# Patient Record
Sex: Female | Born: 1975 | Hispanic: No | Marital: Married | Smoking: Never smoker
Health system: Southern US, Community
[De-identification: ages and names within clinical notes are randomized; demographics above are authoritative.]

## PROBLEM LIST (undated history)

## (undated) DIAGNOSIS — R519 Headache, unspecified: Secondary | ICD-10-CM

## (undated) DIAGNOSIS — F329 Major depressive disorder, single episode, unspecified: Secondary | ICD-10-CM

## (undated) DIAGNOSIS — F419 Anxiety disorder, unspecified: Secondary | ICD-10-CM

## (undated) DIAGNOSIS — R51 Headache: Secondary | ICD-10-CM

## (undated) DIAGNOSIS — F32A Depression, unspecified: Secondary | ICD-10-CM

## (undated) DIAGNOSIS — E119 Type 2 diabetes mellitus without complications: Secondary | ICD-10-CM

---
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
## 2001-12-20 ENCOUNTER — Inpatient Hospital Stay (HOSPITAL_COMMUNITY): Admission: AD | Admit: 2001-12-20 | Discharge: 2001-12-22 | Payer: Self-pay | Admitting: Obstetrics and Gynecology

## 2002-01-25 ENCOUNTER — Other Ambulatory Visit: Admission: RE | Admit: 2002-01-25 | Discharge: 2002-01-25 | Payer: Self-pay | Admitting: Obstetrics and Gynecology

## 2003-02-12 ENCOUNTER — Other Ambulatory Visit: Admission: RE | Admit: 2003-02-12 | Discharge: 2003-02-12 | Payer: Self-pay | Admitting: Obstetrics and Gynecology

## 2003-09-04 ENCOUNTER — Encounter: Admission: RE | Admit: 2003-09-04 | Discharge: 2003-09-04 | Payer: Self-pay | Admitting: Obstetrics and Gynecology

## 2003-11-09 ENCOUNTER — Inpatient Hospital Stay (HOSPITAL_COMMUNITY): Admission: AD | Admit: 2003-11-09 | Discharge: 2003-11-11 | Payer: Self-pay | Admitting: Obstetrics & Gynecology

## 2004-02-13 ENCOUNTER — Other Ambulatory Visit: Admission: RE | Admit: 2004-02-13 | Discharge: 2004-02-13 | Payer: Self-pay | Admitting: Obstetrics and Gynecology

## 2005-03-05 ENCOUNTER — Other Ambulatory Visit: Admission: RE | Admit: 2005-03-05 | Discharge: 2005-03-05 | Payer: Self-pay | Admitting: Obstetrics and Gynecology

## 2005-08-10 ENCOUNTER — Encounter: Admission: RE | Admit: 2005-08-10 | Discharge: 2005-08-10 | Payer: Self-pay | Admitting: Obstetrics and Gynecology

## 2015-02-27 DIAGNOSIS — R6882 Decreased libido: Secondary | ICD-10-CM | POA: Insufficient documentation

## 2016-07-24 DIAGNOSIS — N926 Irregular menstruation, unspecified: Secondary | ICD-10-CM | POA: Insufficient documentation

## 2016-09-23 ENCOUNTER — Encounter (HOSPITAL_COMMUNITY): Payer: Self-pay | Admitting: *Deleted

## 2016-09-29 ENCOUNTER — Other Ambulatory Visit: Payer: Self-pay | Admitting: Obstetrics and Gynecology

## 2016-10-02 ENCOUNTER — Encounter (HOSPITAL_COMMUNITY): Payer: Self-pay | Admitting: Anesthesiology

## 2016-10-02 ENCOUNTER — Ambulatory Visit (HOSPITAL_COMMUNITY): Payer: BC Managed Care – PPO | Admitting: Anesthesiology

## 2016-10-02 ENCOUNTER — Encounter (HOSPITAL_COMMUNITY)
Admission: RE | Disposition: A | Discharge status: Home / Self Care | Payer: Self-pay | Source: Ambulatory Visit | Attending: Obstetrics and Gynecology

## 2016-10-02 ENCOUNTER — Ambulatory Visit (HOSPITAL_COMMUNITY)
Admission: RE | Admit: 2016-10-02 | Discharge: 2016-10-02 | Disposition: A | Discharge status: Home / Self Care | Payer: BC Managed Care – PPO | Source: Ambulatory Visit | Attending: Obstetrics and Gynecology | Admitting: Obstetrics and Gynecology

## 2016-10-02 DIAGNOSIS — Z79899 Other long term (current) drug therapy: Secondary | ICD-10-CM | POA: Diagnosis not present

## 2016-10-02 DIAGNOSIS — N938 Other specified abnormal uterine and vaginal bleeding: Secondary | ICD-10-CM | POA: Insufficient documentation

## 2016-10-02 DIAGNOSIS — F329 Major depressive disorder, single episode, unspecified: Secondary | ICD-10-CM | POA: Insufficient documentation

## 2016-10-02 HISTORY — DX: Major depressive disorder, single episode, unspecified: F32.9

## 2016-10-02 HISTORY — DX: Depression, unspecified: F32.A

## 2016-10-02 HISTORY — DX: Headache: R51

## 2016-10-02 HISTORY — DX: Headache, unspecified: R51.9

## 2016-10-02 HISTORY — DX: Type 2 diabetes mellitus without complications: E11.9

## 2016-10-02 HISTORY — PX: DILATATION & CURETTAGE/HYSTEROSCOPY WITH MYOSURE: SHX6511

## 2016-10-02 HISTORY — DX: Anxiety disorder, unspecified: F41.9

## 2016-10-02 LAB — CBC
HCT: 38.7 % (ref 36.0–46.0)
HEMOGLOBIN: 13.2 g/dL (ref 12.0–15.0)
MCH: 30.6 pg (ref 26.0–34.0)
MCHC: 34.1 g/dL (ref 30.0–36.0)
MCV: 89.6 fL (ref 78.0–100.0)
Platelets: 269 10*3/uL (ref 150–400)
RBC: 4.32 MIL/uL (ref 3.87–5.11)
RDW: 12.9 % (ref 11.5–15.5)
WBC: 8.5 10*3/uL (ref 4.0–10.5)

## 2016-10-02 LAB — PREGNANCY, URINE: Preg Test, Ur: NEGATIVE

## 2016-10-02 SURGERY — DILATATION & CURETTAGE/HYSTEROSCOPY WITH MYOSURE
Anesthesia: General | Site: Vagina

## 2016-10-02 MED ORDER — LIDOCAINE HCL (CARDIAC) 20 MG/ML IV SOLN
INTRAVENOUS | Status: AC
  Filled 2016-10-02: qty 5

## 2016-10-02 MED ORDER — ONDANSETRON HCL 4 MG/2ML IJ SOLN: 4.0000 mg | Freq: Once | INTRAMUSCULAR | Status: DC | PRN

## 2016-10-02 MED ORDER — DEXAMETHASONE SODIUM PHOSPHATE 4 MG/ML IJ SOLN
INTRAMUSCULAR | Status: AC
  Filled 2016-10-02: qty 1

## 2016-10-02 MED ORDER — KETOROLAC TROMETHAMINE 30 MG/ML IJ SOLN: 30.0000 mg | Freq: Once | INTRAMUSCULAR | Status: DC

## 2016-10-02 MED ORDER — LACTATED RINGERS IV SOLN
INTRAVENOUS | Status: DC
  Administered 2016-10-02: 125 mL/h via INTRAVENOUS

## 2016-10-02 MED ORDER — ROCURONIUM BROMIDE 100 MG/10ML IV SOLN
INTRAVENOUS | Status: AC
  Filled 2016-10-02: qty 1

## 2016-10-02 MED ORDER — PROPOFOL 10 MG/ML IV BOLUS
INTRAVENOUS | Status: AC
  Filled 2016-10-02: qty 20

## 2016-10-02 MED ORDER — MIDAZOLAM HCL 2 MG/2ML IJ SOLN
INTRAMUSCULAR | Status: AC
  Filled 2016-10-02: qty 2

## 2016-10-02 MED ORDER — KETOROLAC TROMETHAMINE 30 MG/ML IJ SOLN
INTRAMUSCULAR | Status: DC | PRN
  Administered 2016-10-02: 30 mg via INTRAVENOUS

## 2016-10-02 MED ORDER — KETOROLAC TROMETHAMINE 30 MG/ML IJ SOLN
INTRAMUSCULAR | Status: AC
  Filled 2016-10-02: qty 1

## 2016-10-02 MED ORDER — ONDANSETRON HCL 4 MG/2ML IJ SOLN
INTRAMUSCULAR | Status: AC
  Filled 2016-10-02: qty 2

## 2016-10-02 MED ORDER — ONDANSETRON HCL 4 MG/2ML IJ SOLN
INTRAMUSCULAR | Status: DC | PRN
  Administered 2016-10-02: 4 mg via INTRAVENOUS

## 2016-10-02 MED ORDER — DEXAMETHASONE SODIUM PHOSPHATE 10 MG/ML IJ SOLN
INTRAMUSCULAR | Status: DC | PRN
  Administered 2016-10-02: 4 mg via INTRAVENOUS

## 2016-10-02 MED ORDER — SCOPOLAMINE 1 MG/3DAYS TD PT72
MEDICATED_PATCH | TRANSDERMAL | Status: AC
  Administered 2016-10-02: 1.5 mg via TRANSDERMAL
  Filled 2016-10-02: qty 1

## 2016-10-02 MED ORDER — SODIUM CHLORIDE 0.9 % IR SOLN
Status: DC | PRN
  Administered 2016-10-02: 3000 mL

## 2016-10-02 MED ORDER — OXYCODONE HCL 5 MG PO TABS: 5.0000 mg | ORAL_TABLET | Freq: Once | ORAL | Status: DC | PRN

## 2016-10-02 MED ORDER — LIDOCAINE HCL 1 % IJ SOLN
INTRAMUSCULAR | Status: AC
  Filled 2016-10-02: qty 20

## 2016-10-02 MED ORDER — OXYCODONE HCL 5 MG/5ML PO SOLN: 5.0000 mg | Freq: Once | ORAL | Status: DC | PRN

## 2016-10-02 MED ORDER — PROPOFOL 10 MG/ML IV BOLUS
INTRAVENOUS | Status: DC | PRN
  Administered 2016-10-02: 150 mg via INTRAVENOUS

## 2016-10-02 MED ORDER — FENTANYL CITRATE (PF) 100 MCG/2ML IJ SOLN
INTRAMUSCULAR | Status: DC | PRN
  Administered 2016-10-02: 50 ug via INTRAVENOUS
  Administered 2016-10-02: 100 ug via INTRAVENOUS

## 2016-10-02 MED ORDER — OXYCODONE-ACETAMINOPHEN 5-325 MG PO TABS: 1.0000 | ORAL_TABLET | Freq: Four times a day (QID) | ORAL | 0 refills | Status: DC | PRN

## 2016-10-02 MED ORDER — OXYCODONE-ACETAMINOPHEN 5-325 MG PO TABS: 1.0000 | ORAL_TABLET | Freq: Four times a day (QID) | ORAL | Status: DC | PRN

## 2016-10-02 MED ORDER — FENTANYL CITRATE (PF) 100 MCG/2ML IJ SOLN: 25.0000 ug | INTRAMUSCULAR | Status: DC | PRN

## 2016-10-02 MED ORDER — FENTANYL CITRATE (PF) 250 MCG/5ML IJ SOLN
INTRAMUSCULAR | Status: AC
  Filled 2016-10-02: qty 5

## 2016-10-02 MED ORDER — SCOPOLAMINE 1 MG/3DAYS TD PT72
1.0000 | MEDICATED_PATCH | Freq: Once | TRANSDERMAL | Status: DC
  Administered 2016-10-02: 1.5 mg via TRANSDERMAL

## 2016-10-02 MED ORDER — ACETAMINOPHEN 325 MG PO TABS: 325.0000 mg | ORAL_TABLET | ORAL | Status: DC | PRN

## 2016-10-02 MED ORDER — MIDAZOLAM HCL 2 MG/2ML IJ SOLN
INTRAMUSCULAR | Status: DC | PRN
  Administered 2016-10-02: 2 mg via INTRAVENOUS

## 2016-10-02 MED ORDER — MEPERIDINE HCL 25 MG/ML IJ SOLN: 6.2500 mg | INTRAMUSCULAR | Status: DC | PRN

## 2016-10-02 MED ORDER — LIDOCAINE HCL 1 % IJ SOLN
INTRAMUSCULAR | Status: DC | PRN
  Administered 2016-10-02: 10 mL

## 2016-10-02 MED ORDER — LIDOCAINE HCL (CARDIAC) 20 MG/ML IV SOLN
INTRAVENOUS | Status: DC | PRN
  Administered 2016-10-02: 50 mg via INTRAVENOUS

## 2016-10-02 MED ORDER — ACETAMINOPHEN 160 MG/5ML PO SOLN: 325.0000 mg | ORAL | Status: DC | PRN

## 2016-10-02 SURGICAL SUPPLY — 20 items
CANISTER SUCT 3000ML (MISCELLANEOUS) ×4 IMPLANT
CATH ROBINSON RED A/P 16FR (CATHETERS) ×4 IMPLANT
CLOTH BEACON ORANGE TIMEOUT ST (SAFETY) ×4 IMPLANT
CONTAINER PREFILL 10% NBF 60ML (FORM) ×5 IMPLANT
DEVICE MYOSURE LITE (MISCELLANEOUS) IMPLANT
DEVICE MYOSURE REACH (MISCELLANEOUS) IMPLANT
FILTER ARTHROSCOPY CONVERTOR (FILTER) ×4 IMPLANT
GLOVE BIOGEL PI IND STRL 6.5 (GLOVE) ×2 IMPLANT
GLOVE BIOGEL PI IND STRL 7.0 (GLOVE) ×2 IMPLANT
GLOVE BIOGEL PI INDICATOR 6.5 (GLOVE) ×2
GLOVE BIOGEL PI INDICATOR 7.0 (GLOVE) ×2
GLOVE ECLIPSE 6.5 STRL STRAW (GLOVE) ×4 IMPLANT
GOWN STRL REUS W/TWL LRG LVL3 (GOWN DISPOSABLE) ×8 IMPLANT
PACK VAGINAL MINOR WOMEN LF (CUSTOM PROCEDURE TRAY) ×4 IMPLANT
PAD OB MATERNITY 4.3X12.25 (PERSONAL CARE ITEMS) ×4 IMPLANT
SEAL ROD LENS SCOPE MYOSURE (ABLATOR) IMPLANT
TOWEL OR 17X24 6PK STRL BLUE (TOWEL DISPOSABLE) ×8 IMPLANT
TUBING AQUILEX INFLOW (TUBING) ×4 IMPLANT
TUBING AQUILEX OUTFLOW (TUBING) ×4 IMPLANT
WATER STERILE IRR 1000ML POUR (IV SOLUTION) ×4 IMPLANT

## 2016-10-02 NOTE — Brief Op Note (Signed)
10/02/2016  12:01 PM  PATIENT:  Melissa Rice  41 y.o. female  PRE-OPERATIVE DIAGNOSIS:  Dysfunctional Uterine Bleeding  POST-OPERATIVE DIAGNOSIS:  Dysfunctional Uterine Bleeding  PROCEDURE:  Procedure(s): DILATATION & CURETTAGE/HYSTEROSCOPY (N/A)  SURGEON:  Surgeon(s) and Role:    Allyn Kenner, DO - Primary  ANESTHESIA:   local and MAC  EBL:  Total I/O In: -  Out: 25 [Urine:20; Blood:5]  LOCAL MEDICATIONS USED:  LIDOCAINE  and Amount: 10 ml  SPECIMEN:  Source of Specimen:  EMC  DISPOSITION OF SPECIMEN:  PATHOLOGY  COUNTS:  YES  PLAN OF CARE: Discharge to home after PACU  PATIENT DISPOSITION:  PACU - hemodynamically stable.   Delay start of Pharmacological VTE agent (>24hrs) due to surgical blood loss or risk of bleeding: not applicable

## 2016-10-02 NOTE — H&P (Signed)
41 y.o. yo complains of irregular periods, known small fibroids.  US performed 07/25/2016: uterus AV 9 x 5.8 x 6cm EMS 17.51 with suspected polyp 2.4cm. Fibroids 0.9-3.9cm combo of intramural and subsurosal.   Past Medical History:  Diagnosis Date  . Anxiety   . Depression   . Diabetes mellitus without complication (HCC)    GESTATIONAL  . Headache    OCCASIONAL   Past Surgical History:  Procedure Laterality Date  . GANGLION CYST EXCISION Right 1992    Social History   Social History  . Marital status: Married    Spouse name: N/A  . Number of children: N/A  . Years of education: N/A   Occupational History  . Not on file.   Social History Main Topics  . Smoking status: Never Smoker  . Smokeless tobacco: Never Used  . Alcohol use Yes     Comment: OCCASIONAL  . Drug use: No  . Sexual activity: Not on file   Other Topics Concern  . Not on file   Social History Narrative  . No narrative on file    No current facility-administered medications on file prior to encounter.    No current outpatient prescriptions on file prior to encounter.    No Known Allergies  @VITALS2 @  Lungs: clear to ascultation Cor:  RRR Abdomen:  soft, nontender, nondistended. Ex:  no cords, erythema Pelvic:  Deferred to OR  A:  D&C hysteroscopy polypectomy with myosure   P: All risks, benefits and alternatives d/w patient and she desires to proceed.  Routine pre-op care    CushingALLAHAN, Luther ParodySIDNEY

## 2016-10-02 NOTE — Anesthesia Postprocedure Evaluation (Addendum)
Anesthesia Post Note  Patient: Melissa StarlingJoan S Kates  Procedure(s) Performed: Procedure(s) (LRB): DILATATION & CURETTAGE/HYSTEROSCOPY (N/A)  Patient location during evaluation: PACU Anesthesia Type: General Level of consciousness: awake Pain management: pain level controlled Vital Signs Assessment: post-procedure vital signs reviewed and stable Respiratory status: spontaneous breathing Cardiovascular status: stable Postop Assessment: no signs of nausea or vomiting Anesthetic complications: no        Last Vitals:  Vitals:   10/02/16 1230 10/02/16 1245  BP: 102/66 108/66  Pulse: 86 82  Resp: 12 15  Temp:      Last Pain:  Vitals:   10/02/16 1031  TempSrc: Oral   Pain Goal: Patients Stated Pain Goal: 5 (10/02/16 1031)               Brae Schaafsma JR,JOHN Susann GivensFRANKLIN

## 2016-10-02 NOTE — Anesthesia Procedure Notes (Signed)
Procedure Name: LMA Insertion Date/Time: 10/02/2016 11:37 AM Performed by: Shanon PayorGREGORY, Tyriek Hofman M Pre-anesthesia Checklist: Patient identified, Emergency Drugs available, Suction available, Patient being monitored and Timeout performed Patient Re-evaluated:Patient Re-evaluated prior to inductionOxygen Delivery Method: Circle system utilized Preoxygenation: Pre-oxygenation with 100% oxygen Intubation Type: IV induction LMA: LMA inserted LMA Size: 3.0 Number of attempts: 1 Placement Confirmation: positive ETCO2 and breath sounds checked- equal and bilateral Tube secured with: Tape Dental Injury: Teeth and Oropharynx as per pre-operative assessment

## 2016-10-02 NOTE — Transfer of Care (Signed)
Immediate Anesthesia Transfer of Care Note  Patient: Melissa StarlingJoan S Miron  Procedure(s) Performed: Procedure(s): DILATATION & CURETTAGE/HYSTEROSCOPY (N/A)  Patient Location: PACU  Anesthesia Type:General  Level of Consciousness: awake, alert  and oriented  Airway & Oxygen Therapy: Patient Spontanous Breathing and Patient connected to nasal cannula oxygen  Post-op Assessment: Report given to RN and Post -op Vital signs reviewed and stable  Post vital signs: Reviewed and stable  Last Vitals:  Vitals:   10/02/16 1031  BP: 103/66  Pulse: 61  Resp: 16  Temp: 36.7 C    Last Pain:  Vitals:   10/02/16 1031  TempSrc: Oral      Patients Stated Pain Goal: 5 (10/02/16 1031)  Complications: No apparent anesthesia complications

## 2016-10-02 NOTE — Anesthesia Preprocedure Evaluation (Signed)
Anesthesia Evaluation  Patient identified by MRN, date of birth, ID band Patient awake    Reviewed: Allergy & Precautions, H&P , NPO status , Patient's Chart, lab work & pertinent test results  Airway Mallampati: I  TM Distance: >3 FB Neck ROM: full    Dental no notable dental hx. (+) Teeth Intact   Pulmonary neg pulmonary ROS,    Pulmonary exam normal        Cardiovascular negative cardio ROS Normal cardiovascular exam     Neuro/Psych    GI/Hepatic negative GI ROS, Neg liver ROS,   Endo/Other  diabetes  Renal/GU negative Renal ROS     Musculoskeletal negative musculoskeletal ROS (+)   Abdominal Normal abdominal exam  (+)   Peds  Hematology negative hematology ROS (+)   Anesthesia Other Findings   Reproductive/Obstetrics negative OB ROS                             Anesthesia Physical Anesthesia Plan  ASA: II  Anesthesia Plan: General   Post-op Pain Management:    Induction: Intravenous  Airway Management Planned: LMA  Additional Equipment:   Intra-op Plan:   Post-operative Plan:   Informed Consent: I have reviewed the patients History and Physical, chart, labs and discussed the procedure including the risks, benefits and alternatives for the proposed anesthesia with the patient or authorized representative who has indicated his/her understanding and acceptance.     Plan Discussed with: CRNA and Surgeon  Anesthesia Plan Comments:         Anesthesia Quick Evaluation

## 2016-10-02 NOTE — Discharge Instructions (Signed)

## 2016-10-04 ENCOUNTER — Encounter (HOSPITAL_COMMUNITY): Payer: Self-pay | Admitting: Obstetrics and Gynecology

## 2016-10-29 NOTE — Op Note (Signed)
[name], Melissa Rice NO.:  [id number]  MEDICAL RECORD NO.:  [id number]  LOCATION:                                 FACILITY:  PHYSICIAN:  Allyn Kenner, DO    DATE OF BIRTH:  06-27-1976  DATE OF PROCEDURE:  10/02/2016 DATE OF DISCHARGE:                              OPERATIVE REPORT   PREOPERATIVE DIAGNOSIS:  Abnormal uterine bleeding.  POSTOPERATIVE DIAGNOSIS:  Abnormal uterine bleeding.  PROCEDURE:  Dilation and curettage with hysteroscopy.  SURGEON:  Allyn Kenner, DO.  ANESTHESIA:  Local and MAC.  ESTIMATED BLOOD LOSS:  Minimal.  URINE OUTPUT:  20 mL.  STRAIGHT CATH:  Local.  ANESTHESIA USED:  10 mL of 1% lidocaine.  SPECIMENS:  Endometrial curettings.  Disposition to Pathology.  FINDINGS:  Normal appearing uterine cavity.  COMPLICATIONS:  None.  CONDITION:  Stable to PACU.  DESCRIPTION OF PROCEDURE:  The patient was taken to the operating room where anesthesia was administered and found to be adequate.  She was prepped and draped in the normal sterile fashion in dorsal lithotomy position.  A speculum was placed in the vagina and the cervix visualized and grasped with a single-tooth tenaculum.  The cervical os was then serially dilated with Kennon Rounds dilators to accommodate a 5 mm scope.  The hysteroscope was advanced with good visualization.  Findings noted above.  The hysteroscope was then removed. A gentle curettage of all 4 quadrants was performed, and moderate tissue was sent to pathology.  All instruments were removed.  Tenaculum sites were hemostatic with pressure.  The patient tolerated the procedure well.  Sponge, lap, and needle counts were correct x2.  The patient was taken to Recovery in stable condition.    ______________________________ Allyn Kenner, DO   ______________________________ Allyn Kenner, DO    /MEDQ  D:  10/28/2016  T:  10/29/2016  Job:  [id number]

## 2017-03-05 NOTE — Addendum Note (Signed)
Addendum  created 03/05/17 0845 by Leilani AbleHatchett, Cordero Surette, MD   Sign clinical note

## 2018-11-15 DIAGNOSIS — N939 Abnormal uterine and vaginal bleeding, unspecified: Secondary | ICD-10-CM | POA: Insufficient documentation

## 2019-06-02 ENCOUNTER — Other Ambulatory Visit: Payer: Self-pay

## 2019-06-02 DIAGNOSIS — Z20822 Contact with and (suspected) exposure to covid-19: Secondary | ICD-10-CM

## 2019-06-04 LAB — NOVEL CORONAVIRUS, NAA: SARS-CoV-2, NAA: NOT DETECTED

## 2019-08-02 ENCOUNTER — Other Ambulatory Visit: Payer: Self-pay

## 2019-08-02 DIAGNOSIS — Z20822 Contact with and (suspected) exposure to covid-19: Secondary | ICD-10-CM

## 2019-08-03 LAB — NOVEL CORONAVIRUS, NAA: SARS-CoV-2, NAA: NOT DETECTED

## 2020-10-17 DIAGNOSIS — Z1152 Encounter for screening for COVID-19: Secondary | ICD-10-CM | POA: Diagnosis not present

## 2020-10-22 DIAGNOSIS — R5383 Other fatigue: Secondary | ICD-10-CM | POA: Diagnosis not present

## 2020-10-22 DIAGNOSIS — N390 Urinary tract infection, site not specified: Secondary | ICD-10-CM | POA: Diagnosis not present

## 2020-10-22 DIAGNOSIS — R509 Fever, unspecified: Secondary | ICD-10-CM | POA: Diagnosis not present

## 2020-11-01 DIAGNOSIS — M549 Dorsalgia, unspecified: Secondary | ICD-10-CM | POA: Diagnosis not present

## 2020-11-01 DIAGNOSIS — K625 Hemorrhage of anus and rectum: Secondary | ICD-10-CM | POA: Diagnosis not present

## 2020-11-05 DIAGNOSIS — Z01419 Encounter for gynecological examination (general) (routine) without abnormal findings: Secondary | ICD-10-CM | POA: Diagnosis not present

## 2020-11-05 DIAGNOSIS — Z6824 Body mass index (BMI) 24.0-24.9, adult: Secondary | ICD-10-CM | POA: Diagnosis not present

## 2020-11-05 DIAGNOSIS — Z1231 Encounter for screening mammogram for malignant neoplasm of breast: Secondary | ICD-10-CM | POA: Diagnosis not present

## 2020-11-05 DIAGNOSIS — R682 Dry mouth, unspecified: Secondary | ICD-10-CM | POA: Diagnosis not present

## 2020-11-07 DIAGNOSIS — R928 Other abnormal and inconclusive findings on diagnostic imaging of breast: Secondary | ICD-10-CM | POA: Insufficient documentation

## 2020-11-08 ENCOUNTER — Other Ambulatory Visit: Payer: Self-pay | Admitting: Obstetrics and Gynecology

## 2020-11-08 DIAGNOSIS — R928 Other abnormal and inconclusive findings on diagnostic imaging of breast: Secondary | ICD-10-CM

## 2020-11-12 DIAGNOSIS — H40033 Anatomical narrow angle, bilateral: Secondary | ICD-10-CM | POA: Diagnosis not present

## 2020-11-15 ENCOUNTER — Ambulatory Visit: Payer: Self-pay

## 2020-11-15 ENCOUNTER — Ambulatory Visit
Admission: RE | Admit: 2020-11-15 | Discharge: 2020-11-15 | Disposition: A | Discharge status: Home / Self Care | Payer: BC Managed Care – PPO | Source: Ambulatory Visit | Attending: Obstetrics and Gynecology | Admitting: Obstetrics and Gynecology

## 2020-11-15 ENCOUNTER — Other Ambulatory Visit: Payer: Self-pay

## 2020-11-15 DIAGNOSIS — R928 Other abnormal and inconclusive findings on diagnostic imaging of breast: Secondary | ICD-10-CM

## 2020-11-15 DIAGNOSIS — R922 Inconclusive mammogram: Secondary | ICD-10-CM | POA: Diagnosis not present

## 2020-11-26 ENCOUNTER — Other Ambulatory Visit: Payer: Self-pay

## 2020-12-10 DIAGNOSIS — L309 Dermatitis, unspecified: Secondary | ICD-10-CM | POA: Diagnosis not present

## 2020-12-27 DIAGNOSIS — H40031 Anatomical narrow angle, right eye: Secondary | ICD-10-CM | POA: Diagnosis not present

## 2020-12-31 DIAGNOSIS — H40032 Anatomical narrow angle, left eye: Secondary | ICD-10-CM | POA: Diagnosis not present

## 2021-02-17 DIAGNOSIS — F411 Generalized anxiety disorder: Secondary | ICD-10-CM | POA: Diagnosis not present

## 2021-03-04 DIAGNOSIS — R21 Rash and other nonspecific skin eruption: Secondary | ICD-10-CM | POA: Diagnosis not present

## 2021-05-26 DIAGNOSIS — L237 Allergic contact dermatitis due to plants, except food: Secondary | ICD-10-CM | POA: Diagnosis not present

## 2021-10-27 DIAGNOSIS — M79662 Pain in left lower leg: Secondary | ICD-10-CM | POA: Diagnosis not present

## 2021-11-06 DIAGNOSIS — K5730 Diverticulosis of large intestine without perforation or abscess without bleeding: Secondary | ICD-10-CM | POA: Diagnosis not present

## 2021-11-06 DIAGNOSIS — Z1211 Encounter for screening for malignant neoplasm of colon: Secondary | ICD-10-CM | POA: Diagnosis not present

## 2021-11-14 DIAGNOSIS — M9903 Segmental and somatic dysfunction of lumbar region: Secondary | ICD-10-CM | POA: Diagnosis not present

## 2021-11-14 DIAGNOSIS — M9905 Segmental and somatic dysfunction of pelvic region: Secondary | ICD-10-CM | POA: Diagnosis not present

## 2021-11-14 DIAGNOSIS — M7661 Achilles tendinitis, right leg: Secondary | ICD-10-CM | POA: Diagnosis not present

## 2021-11-14 DIAGNOSIS — M9901 Segmental and somatic dysfunction of cervical region: Secondary | ICD-10-CM | POA: Diagnosis not present

## 2021-11-14 DIAGNOSIS — M9902 Segmental and somatic dysfunction of thoracic region: Secondary | ICD-10-CM | POA: Diagnosis not present

## 2021-11-18 DIAGNOSIS — M7661 Achilles tendinitis, right leg: Secondary | ICD-10-CM | POA: Diagnosis not present

## 2021-11-18 DIAGNOSIS — M9902 Segmental and somatic dysfunction of thoracic region: Secondary | ICD-10-CM | POA: Diagnosis not present

## 2021-11-18 DIAGNOSIS — M9905 Segmental and somatic dysfunction of pelvic region: Secondary | ICD-10-CM | POA: Diagnosis not present

## 2021-11-18 DIAGNOSIS — M9903 Segmental and somatic dysfunction of lumbar region: Secondary | ICD-10-CM | POA: Diagnosis not present

## 2021-11-18 DIAGNOSIS — M9901 Segmental and somatic dysfunction of cervical region: Secondary | ICD-10-CM | POA: Diagnosis not present

## 2021-11-21 DIAGNOSIS — M7661 Achilles tendinitis, right leg: Secondary | ICD-10-CM | POA: Diagnosis not present

## 2021-11-21 DIAGNOSIS — M9905 Segmental and somatic dysfunction of pelvic region: Secondary | ICD-10-CM | POA: Diagnosis not present

## 2021-11-21 DIAGNOSIS — M9903 Segmental and somatic dysfunction of lumbar region: Secondary | ICD-10-CM | POA: Diagnosis not present

## 2021-11-21 DIAGNOSIS — M9902 Segmental and somatic dysfunction of thoracic region: Secondary | ICD-10-CM | POA: Diagnosis not present

## 2021-11-21 DIAGNOSIS — M9901 Segmental and somatic dysfunction of cervical region: Secondary | ICD-10-CM | POA: Diagnosis not present

## 2021-11-25 DIAGNOSIS — M9901 Segmental and somatic dysfunction of cervical region: Secondary | ICD-10-CM | POA: Diagnosis not present

## 2021-11-25 DIAGNOSIS — M7661 Achilles tendinitis, right leg: Secondary | ICD-10-CM | POA: Diagnosis not present

## 2021-11-25 DIAGNOSIS — M9905 Segmental and somatic dysfunction of pelvic region: Secondary | ICD-10-CM | POA: Diagnosis not present

## 2021-11-25 DIAGNOSIS — M9903 Segmental and somatic dysfunction of lumbar region: Secondary | ICD-10-CM | POA: Diagnosis not present

## 2021-11-25 DIAGNOSIS — M9902 Segmental and somatic dysfunction of thoracic region: Secondary | ICD-10-CM | POA: Diagnosis not present

## 2021-12-10 DIAGNOSIS — Z1231 Encounter for screening mammogram for malignant neoplasm of breast: Secondary | ICD-10-CM | POA: Diagnosis not present

## 2021-12-10 DIAGNOSIS — Z6823 Body mass index (BMI) 23.0-23.9, adult: Secondary | ICD-10-CM | POA: Diagnosis not present

## 2021-12-10 DIAGNOSIS — Z01419 Encounter for gynecological examination (general) (routine) without abnormal findings: Secondary | ICD-10-CM | POA: Diagnosis not present

## 2022-01-01 DIAGNOSIS — N950 Postmenopausal bleeding: Secondary | ICD-10-CM | POA: Diagnosis not present

## 2022-02-17 DIAGNOSIS — F411 Generalized anxiety disorder: Secondary | ICD-10-CM | POA: Diagnosis not present

## 2022-03-16 ENCOUNTER — Telehealth: Payer: BC Managed Care – PPO | Admitting: Physician Assistant

## 2022-03-16 DIAGNOSIS — J020 Streptococcal pharyngitis: Secondary | ICD-10-CM

## 2022-03-16 MED ORDER — AMOXICILLIN 500 MG PO CAPS: 500.0000 mg | ORAL_CAPSULE | Freq: Two times a day (BID) | ORAL | 0 refills | Status: AC

## 2022-03-16 NOTE — Progress Notes (Signed)
Virtual Visit Consent   Melissa Rice, you are scheduled for a virtual visit with a [facility] provider today. Just as with appointments in the office, your consent must be obtained to participate. Your consent will be active for this visit and any virtual visit you may have with one of our providers in the next 365 days. If you have a MyChart account, a copy of this consent can be sent to you electronically.  As this is a virtual visit, video technology does not allow for your provider to perform a traditional examination. This may limit your provider's ability to fully assess your condition. If your provider identifies any concerns that need to be evaluated in person or the need to arrange testing (such as labs, EKG, etc.), we will make arrangements to do so. Although advances in technology are sophisticated, we cannot ensure that it will always work on either your end or our end. If the connection with a video visit is poor, the visit may have to be switched to a telephone visit. With either a video or telephone visit, we are not always able to ensure that we have a secure connection.  By engaging in this virtual visit, you consent to the provision of healthcare and authorize for your insurance to be billed (if applicable) for the services provided during this visit. Depending on your insurance coverage, you may receive a charge related to this service.  I need to obtain your verbal consent now. Are you willing to proceed with your visit today? ANDREE HEEG has provided verbal consent on 03/16/2022 for a virtual visit (video or telephone). Margaretann Loveless, PA-C  Date: 03/16/2022 8:26 AM  Virtual Visit via Video Note   I, Margaretann Loveless, connected with  Melissa Rice  ([id number], Feb 20, 1976) on 03/16/22 at  8:30 AM EDT by a video-enabled telemedicine application and verified that I am speaking with the correct person using two identifiers.  Location: Patient: Virtual Visit Location  Patient: Home Provider: Virtual Visit Location Provider: Home Office   I discussed the limitations of evaluation and management by telemedicine and the availability of in person appointments. The patient expressed understanding and agreed to proceed.    History of Present Illness: Melissa Rice is a 46 y.o. who identifies as a female who was assigned female at birth, and is being seen today for sore throat.  HPI: Sore Throat  This is a new problem. The current episode started in the past 7 days (Saturday night). The problem has been gradually worsening. Maximum temperature: subjective fevers. The fever has been present for 1 to 2 days. The pain is moderate. Associated symptoms include congestion, headaches, swollen glands and trouble swallowing. Pertinent negatives include no hoarse voice or shortness of breath. She has had exposure to strep. She has had no exposure to mono. Treatments tried: flonase, tylenol, ibuprofen, nyquil. The treatment provided no relief.      Problems: There are no problems to display for this patient.   Allergies: No Known Allergies Medications:  Current Outpatient Medications:    amoxicillin (AMOXIL) 500 MG capsule, Take 1 capsule (500 mg total) by mouth 2 (two) times daily for 10 days., Disp: 20 capsule, Rfl: 0   acetaminophen (TYLENOL) 500 MG tablet, Take 1,000 mg by mouth 2 (two) times daily as needed for headache., Disp: , Rfl:    FLUoxetine (PROZAC) 10 MG tablet, Take 10 mg by mouth daily., Disp: , Rfl:    ibuprofen (ADVIL,MOTRIN) 200 MG  tablet, Take 400 mg by mouth 2 (two) times daily as needed for headache or moderate pain., Disp: , Rfl:    oxyCODONE-acetaminophen (PERCOCET/ROXICET) 5-325 MG tablet, Take 1 tablet by mouth every 6 (six) hours as needed for severe pain., Disp: 6 tablet, Rfl: 0   valACYclovir (VALTREX) 500 MG tablet, Take 1,000 mg by mouth 2 (two) times daily as needed (fever blisters). For 3 to 4 days , Disp: , Rfl:    Observations/Objective: Patient is well-developed, well-nourished in no acute distress.  Resting comfortably at home.  Head is normocephalic, atraumatic.  No labored breathing.  Speech is clear and coherent with logical content.  Patient is alert and oriented at baseline.    Assessment and Plan: 1. Strep throat - amoxicillin (AMOXIL) 500 MG capsule; Take 1 capsule (500 mg total) by mouth 2 (two) times daily for 10 days.  Dispense: 20 capsule; Refill: 0  - Suspect strep throat - Amoxicillin prescribed - Tylenol and Ibuprofen alternating every 4 hours - Salt water gargles - Chloraseptic spray - Liquid and soft food diet - Push fluids - New toothbrush in 3 days - Seek in person evaluation if not improving or if symptoms worsen   Follow Up Instructions: I discussed the assessment and treatment plan with the patient. The patient was provided an opportunity to ask questions and all were answered. The patient agreed with the plan and demonstrated an understanding of the instructions.  A copy of instructions were sent to the patient via MyChart unless otherwise noted below.    The patient was advised to call back or seek an in-person evaluation if the symptoms worsen or if the condition fails to improve as anticipated.  Time:  I spent 8 minutes with the patient via telehealth technology discussing the above problems/concerns.    Margaretann Loveless, PA-C

## 2022-03-16 NOTE — Patient Instructions (Signed)
Hansel Starling, thank you for joining Margaretann Loveless, PA-C for today's virtual visit.  While this provider is not your primary care provider (PCP), if your PCP is located in our provider database this encounter information will be shared with them immediately following your visit.  Consent: (Patient) Melissa Rice provided verbal consent for this virtual visit at the beginning of the encounter.  Current Medications:  Current Outpatient Medications:    amoxicillin (AMOXIL) 500 MG capsule, Take 1 capsule (500 mg total) by mouth 2 (two) times daily for 10 days., Disp: 20 capsule, Rfl: 0   acetaminophen (TYLENOL) 500 MG tablet, Take 1,000 mg by mouth 2 (two) times daily as needed for headache., Disp: , Rfl:    FLUoxetine (PROZAC) 10 MG tablet, Take 10 mg by mouth daily., Disp: , Rfl:    ibuprofen (ADVIL,MOTRIN) 200 MG tablet, Take 400 mg by mouth 2 (two) times daily as needed for headache or moderate pain., Disp: , Rfl:    oxyCODONE-acetaminophen (PERCOCET/ROXICET) 5-325 MG tablet, Take 1 tablet by mouth every 6 (six) hours as needed for severe pain., Disp: 6 tablet, Rfl: 0   valACYclovir (VALTREX) 500 MG tablet, Take 1,000 mg by mouth 2 (two) times daily as needed (fever blisters). For 3 to 4 days , Disp: , Rfl:    Medications ordered in this encounter:  Meds ordered this encounter  Medications   amoxicillin (AMOXIL) 500 MG capsule    Sig: Take 1 capsule (500 mg total) by mouth 2 (two) times daily for 10 days.    Dispense:  20 capsule    Refill:  0    Order Specific Question:   Supervising Provider    Answer:   Hyacinth Meeker, BRIAN [[id number]]     *If you need refills on other medications prior to your next appointment, please contact your pharmacy*  Follow-Up: Call back or seek an in-person evaluation if the symptoms worsen or if the condition fails to improve as anticipated.  Other Instructions Strep Throat, Adult Strep throat is an infection in the throat that is caused by bacteria. It is  common during the cold months of the year. It mostly affects children who are 79-66 years old. However, people of all ages can get it at any time of the year. This infection spreads from person to person (is contagious) through coughing, sneezing, or having close contact. Your health care provider may use other names to describe the infection. When strep throat affects the tonsils, it is called tonsillitis. When it affects the back of the throat, it is called pharyngitis. What are the causes? This condition is caused by the Streptococcus pyogenes bacteria. What increases the risk? You are more likely to develop this condition if: You care for school-age children, or are around school-age children. Children are more likely to get strep throat and may spread it to others. You spend time in crowded places where the infection can spread easily. You have close contact with someone who has strep throat. What are the signs or symptoms? Symptoms of this condition include: Fever or chills. Redness, swelling, or pain in the tonsils or throat. Pain or difficulty when swallowing. White or yellow spots on the tonsils or throat. Tender glands in the neck and under the jaw. Bad smelling breath. Red rash all over the body. This is rare. How is this diagnosed? This condition is diagnosed by tests that check for the presence and the amount of bacteria that cause strep throat. They are: Rapid strep  test. Your throat is swabbed and checked for the presence of bacteria. Results are usually ready in minutes. Throat culture test. Your throat is swabbed. The sample is placed in a cup that allows infections to grow. Results are usually ready in 1 or 2 days. How is this treated? This condition may be treated with: Medicines that kill germs (antibiotics). Medicines that relieve pain or fever. These include: Ibuprofen or acetaminophen. Aspirin, only for people who are over the age of 40. Throat lozenges. Throat  sprays. Follow these instructions at home: Medicines  Take over-the-counter and prescription medicines only as told by your health care provider. Take your antibiotic medicine as told by your health care provider. Do not stop taking the antibiotic even if you start to feel better. Eating and drinking  If you have trouble swallowing, try eating soft foods until your sore throat feels better. Drink enough fluid to keep your urine pale yellow. To help relieve pain, you may have: Warm fluids, such as soup and tea. Cold fluids, such as frozen desserts or popsicles. General instructions Gargle with a salt-water mixture 3-4 times a day or as needed. To make a salt-water mixture, completely dissolve -1 tsp (3-6 g) of salt in 1 cup (237 mL) of warm water. Get plenty of rest. Stay home from work or school until you have been taking antibiotics for 24 hours. Do not use any products that contain nicotine or tobacco. These products include cigarettes, chewing tobacco, and vaping devices, such as e-cigarettes. If you need help quitting, ask your health care provider. It is up to you to get your test results. Ask your health care provider, or the department that is doing the test, when your results will be ready. Keep all follow-up visits. This is important. How is this prevented?  Do not share food, drinking cups, or personal items that could cause the infection to spread to other people. Wash your hands often with soap and water for at least 20 seconds. If soap and water are not available, use hand sanitizer. Make sure that all people in your house wash their hands well. Have family members tested if they have a sore throat or fever. They may need an antibiotic if they have strep throat. Contact a health care provider if: You have swelling in your neck that keeps getting bigger. You develop a rash, cough, or earache. You cough up a thick mucus that is green, yellow-brown, or bloody. You have pain or  discomfort that does not get better with medicine. Your symptoms seem to be getting worse. You have a fever. Get help right away if: You have new symptoms, such as vomiting, severe headache, stiff or painful neck, chest pain, or shortness of breath. You have severe throat pain, drooling, or changes in your voice. You have swelling of the neck, or the skin on the neck becomes red and tender. You have signs of dehydration, such as tiredness (fatigue), dry mouth, and decreased urination. You become increasingly sleepy, or you cannot wake up completely. Your joints become red or painful. These symptoms may represent a serious problem that is an emergency. Do not wait to see if the symptoms will go away. Get medical help right away. Call your local emergency services (911 in the [place] Do not drive yourself to the hospital. Summary Strep throat is an infection in the throat that is caused by the Streptococcus pyogenes bacteria. This infection is spread from person to person (is contagious) through coughing, sneezing, or having  close contact. Take your medicines, including antibiotics, as told by your health care provider. Do not stop taking the antibiotic even if you start to feel better. To prevent the spread of germs, wash your hands well with soap and water. Have others do the same. Do not share food, drinking cups, or personal items. Get help right away if you have new symptoms, such as vomiting, severe headache, stiff or painful neck, chest pain, or shortness of breath. This information is not intended to replace advice given to you by your health care provider. Make sure you discuss any questions you have with your health care provider. Document Revised: 01/07/2021 Document Reviewed: 01/07/2021 Elsevier Patient Education  2023 Elsevier Inc.    If you have been instructed to have an in-person evaluation today at a local Urgent Care facility, please use the link below. It will take you to a  list of all of our available [facility], including address, phone number and hours of operation. Please do not delay care.  [facility]  If you or a family member do not have a primary care provider, use the link below to schedule a visit and establish care. When you choose a [facility] primary care physician or advanced practice provider, you gain a long-term partner in health. Find a Primary Care Provider  Learn more about [facility]'s in-office and virtual care options: [facility] - Get Care Now

## 2022-04-20 DIAGNOSIS — M9903 Segmental and somatic dysfunction of lumbar region: Secondary | ICD-10-CM | POA: Diagnosis not present

## 2022-04-20 DIAGNOSIS — M9901 Segmental and somatic dysfunction of cervical region: Secondary | ICD-10-CM | POA: Diagnosis not present

## 2022-04-20 DIAGNOSIS — M7661 Achilles tendinitis, right leg: Secondary | ICD-10-CM | POA: Diagnosis not present

## 2022-04-20 DIAGNOSIS — M9902 Segmental and somatic dysfunction of thoracic region: Secondary | ICD-10-CM | POA: Diagnosis not present

## 2022-04-20 DIAGNOSIS — M9905 Segmental and somatic dysfunction of pelvic region: Secondary | ICD-10-CM | POA: Diagnosis not present

## 2022-04-24 DIAGNOSIS — M9901 Segmental and somatic dysfunction of cervical region: Secondary | ICD-10-CM | POA: Diagnosis not present

## 2022-04-24 DIAGNOSIS — M9903 Segmental and somatic dysfunction of lumbar region: Secondary | ICD-10-CM | POA: Diagnosis not present

## 2022-04-24 DIAGNOSIS — M9905 Segmental and somatic dysfunction of pelvic region: Secondary | ICD-10-CM | POA: Diagnosis not present

## 2022-04-24 DIAGNOSIS — M9902 Segmental and somatic dysfunction of thoracic region: Secondary | ICD-10-CM | POA: Diagnosis not present

## 2022-04-24 DIAGNOSIS — M9906 Segmental and somatic dysfunction of lower extremity: Secondary | ICD-10-CM | POA: Diagnosis not present

## 2022-05-01 DIAGNOSIS — M9901 Segmental and somatic dysfunction of cervical region: Secondary | ICD-10-CM | POA: Diagnosis not present

## 2022-05-01 DIAGNOSIS — M9905 Segmental and somatic dysfunction of pelvic region: Secondary | ICD-10-CM | POA: Diagnosis not present

## 2022-05-01 DIAGNOSIS — M9902 Segmental and somatic dysfunction of thoracic region: Secondary | ICD-10-CM | POA: Diagnosis not present

## 2022-05-01 DIAGNOSIS — M9903 Segmental and somatic dysfunction of lumbar region: Secondary | ICD-10-CM | POA: Diagnosis not present

## 2022-05-01 DIAGNOSIS — M9906 Segmental and somatic dysfunction of lower extremity: Secondary | ICD-10-CM | POA: Diagnosis not present

## 2022-05-04 DIAGNOSIS — M67872 Other specified disorders of synovium, left ankle and foot: Secondary | ICD-10-CM | POA: Diagnosis not present

## 2022-05-04 DIAGNOSIS — M67871 Other specified disorders of synovium, right ankle and foot: Secondary | ICD-10-CM | POA: Diagnosis not present

## 2022-05-15 DIAGNOSIS — M7662 Achilles tendinitis, left leg: Secondary | ICD-10-CM | POA: Diagnosis not present

## 2022-05-15 DIAGNOSIS — M7661 Achilles tendinitis, right leg: Secondary | ICD-10-CM | POA: Diagnosis not present

## 2022-06-04 DIAGNOSIS — M9905 Segmental and somatic dysfunction of pelvic region: Secondary | ICD-10-CM | POA: Diagnosis not present

## 2022-06-04 DIAGNOSIS — M9902 Segmental and somatic dysfunction of thoracic region: Secondary | ICD-10-CM | POA: Diagnosis not present

## 2022-06-04 DIAGNOSIS — M9903 Segmental and somatic dysfunction of lumbar region: Secondary | ICD-10-CM | POA: Diagnosis not present

## 2022-06-04 DIAGNOSIS — M9901 Segmental and somatic dysfunction of cervical region: Secondary | ICD-10-CM | POA: Diagnosis not present

## 2022-06-04 DIAGNOSIS — M9906 Segmental and somatic dysfunction of lower extremity: Secondary | ICD-10-CM | POA: Diagnosis not present

## 2022-08-05 DIAGNOSIS — L309 Dermatitis, unspecified: Secondary | ICD-10-CM | POA: Diagnosis not present

## 2022-08-05 DIAGNOSIS — S40811A Abrasion of right upper arm, initial encounter: Secondary | ICD-10-CM | POA: Diagnosis not present

## 2022-08-05 DIAGNOSIS — Z23 Encounter for immunization: Secondary | ICD-10-CM | POA: Diagnosis not present

## 2022-11-13 DIAGNOSIS — M67871 Other specified disorders of synovium, right ankle and foot: Secondary | ICD-10-CM | POA: Diagnosis not present

## 2022-11-13 DIAGNOSIS — M6701 Short Achilles tendon (acquired), right ankle: Secondary | ICD-10-CM | POA: Diagnosis not present

## 2022-11-18 DIAGNOSIS — L209 Atopic dermatitis, unspecified: Secondary | ICD-10-CM | POA: Diagnosis not present

## 2022-11-18 DIAGNOSIS — L299 Pruritus, unspecified: Secondary | ICD-10-CM | POA: Diagnosis not present

## 2022-12-22 DIAGNOSIS — Z1231 Encounter for screening mammogram for malignant neoplasm of breast: Secondary | ICD-10-CM | POA: Diagnosis not present

## 2022-12-22 DIAGNOSIS — Z01419 Encounter for gynecological examination (general) (routine) without abnormal findings: Secondary | ICD-10-CM | POA: Diagnosis not present

## 2022-12-22 DIAGNOSIS — Z6824 Body mass index (BMI) 24.0-24.9, adult: Secondary | ICD-10-CM | POA: Diagnosis not present

## 2023-01-05 IMAGING — MG MM DIGITAL DIAGNOSTIC UNILAT*L* W/ TOMO W/ CAD
4 series · 4 of 12 positions shown · non-contrast
Comparison: Previous exam(s).

CLINICAL DATA: 44-year-old female recalled from screening mammogram
dated 11/05/2020 for a possible left breast mass

EXAM:
DIGITAL DIAGNOSTIC UNILATERAL LEFT MAMMOGRAM WITH TOMOSYNTHESIS AND
CAD
TECHNIQUE: Left digital diagnostic mammography and breast tomosynthesis was
performed. The images were evaluated with computer-aided detection.

[L ML synth-2D]
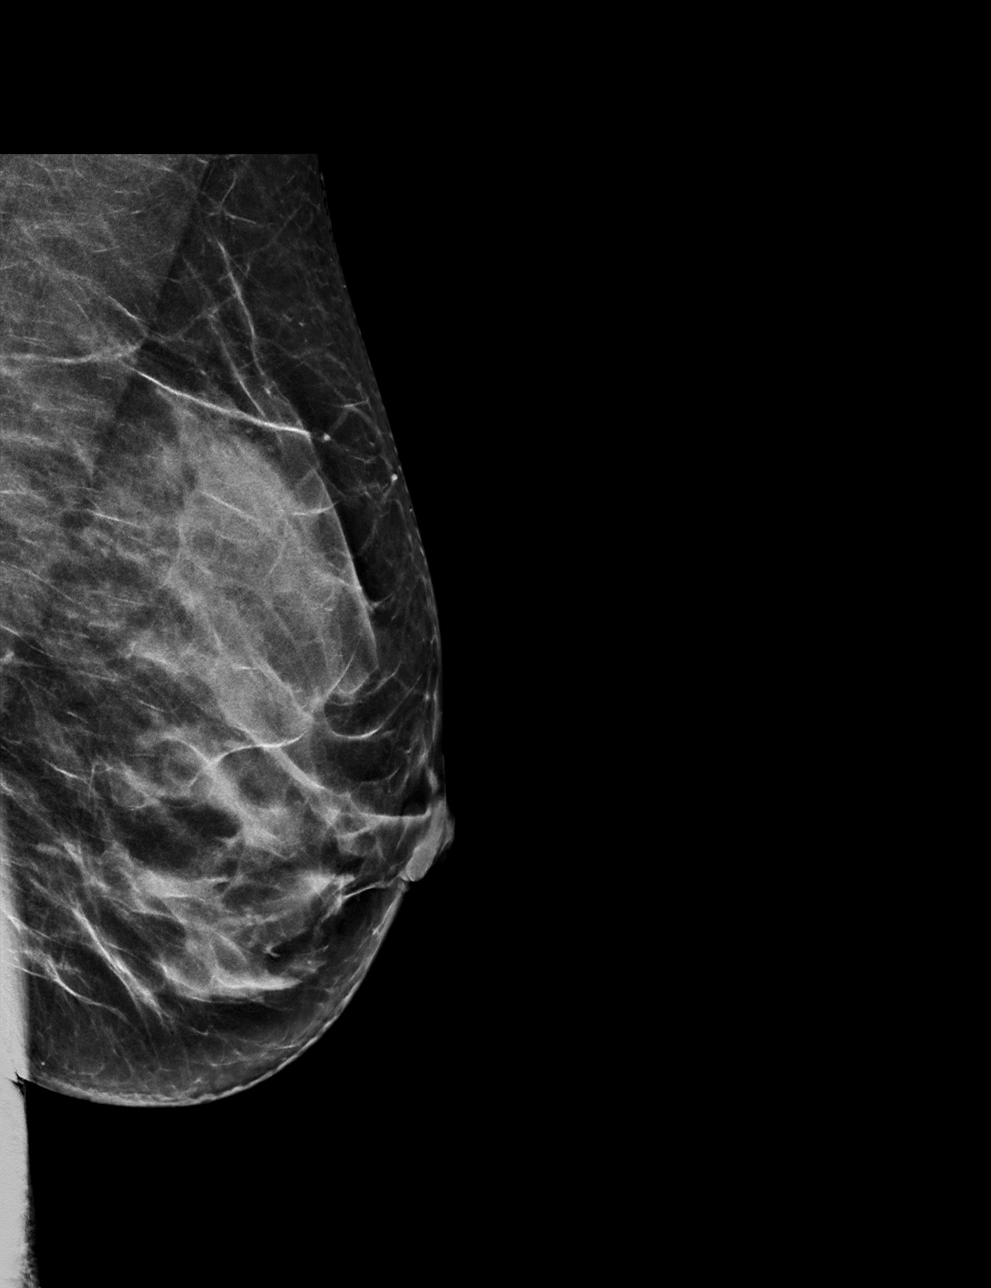

[L MLO synth-2D]
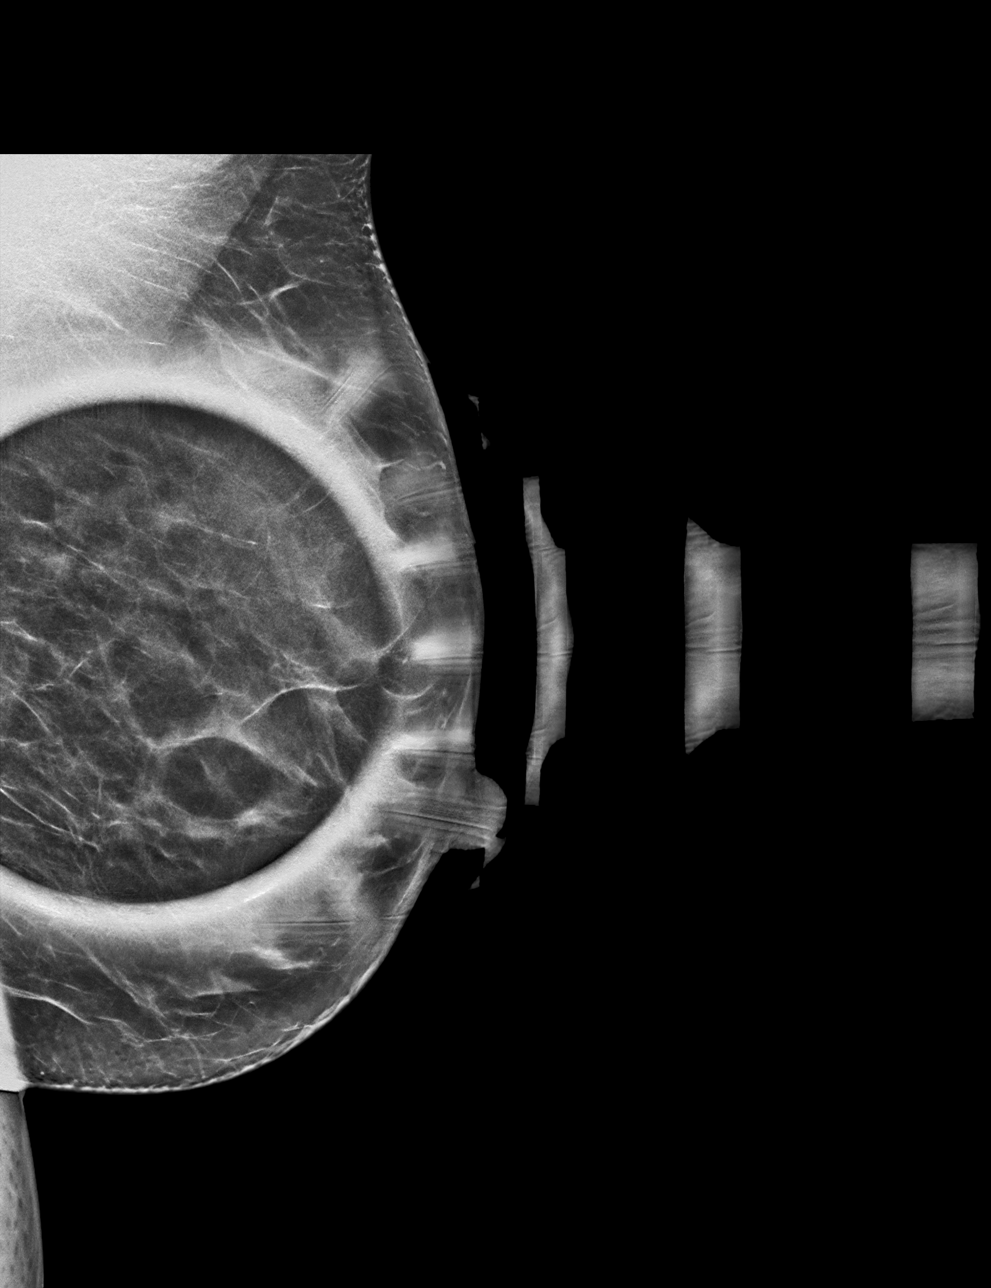

[L MLO tomo · tomo slice 27/54.0]
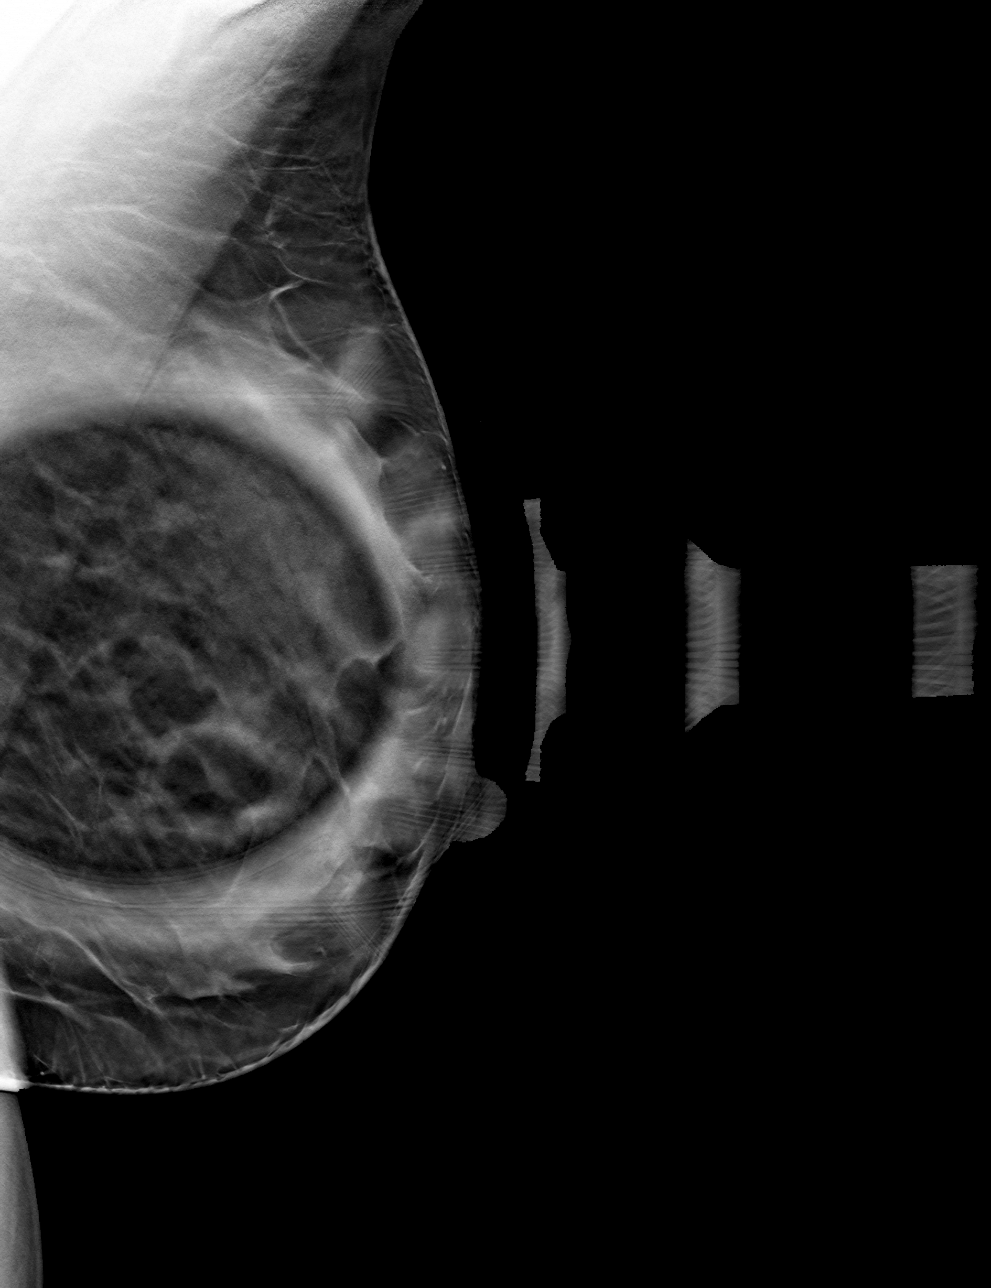

[L ML tomo · tomo slice 29/58.0]
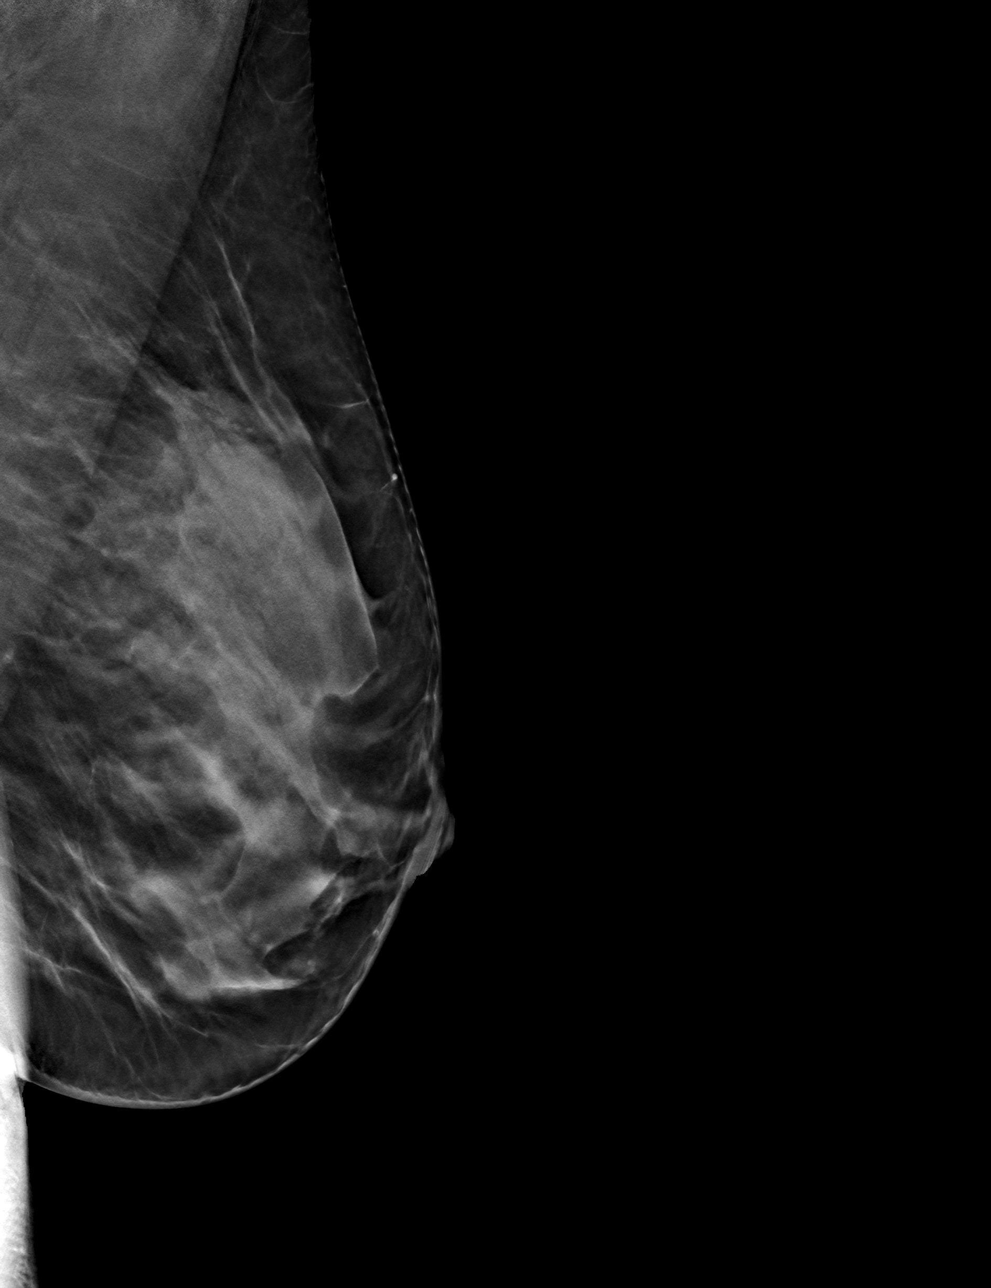

[4 of 12 positions shown; findings below may reference images not displayed]

ACR Breast Density Category d: The breast tissue is extremely dense,
which lowers the sensitivity of mammography.
FINDINGS: Previously described, possible mass in the far posterior left breast
seen on projection only resolves into well dispersed tissue on
today's additional views. No suspicious findings.
IMPRESSION: No mammographic evidence of malignancy.

RECOMMENDATION:
Screening mammogram in one year.(Code:T6-3-PLX)

I have discussed the findings and recommendations with the patient.
If applicable, a reminder letter will be sent to the patient
regarding the next appointment.

BI-RADS CATEGORY  1: Negative.

## 2023-01-26 DIAGNOSIS — L209 Atopic dermatitis, unspecified: Secondary | ICD-10-CM | POA: Diagnosis not present

## 2023-02-09 DIAGNOSIS — L209 Atopic dermatitis, unspecified: Secondary | ICD-10-CM | POA: Diagnosis not present

## 2023-02-23 DIAGNOSIS — L209 Atopic dermatitis, unspecified: Secondary | ICD-10-CM | POA: Diagnosis not present

## 2023-03-19 ENCOUNTER — Ambulatory Visit
Admission: EM | Admit: 2023-03-19 | Discharge: 2023-03-19 | Disposition: A | Discharge status: Home / Self Care | Payer: BC Managed Care – PPO

## 2023-03-19 DIAGNOSIS — S70361A Insect bite (nonvenomous), right thigh, initial encounter: Secondary | ICD-10-CM | POA: Diagnosis not present

## 2023-03-19 DIAGNOSIS — L03115 Cellulitis of right lower limb: Secondary | ICD-10-CM

## 2023-03-19 DIAGNOSIS — W57XXXA Bitten or stung by nonvenomous insect and other nonvenomous arthropods, initial encounter: Secondary | ICD-10-CM | POA: Diagnosis not present

## 2023-03-19 MED ORDER — CEPHALEXIN 500 MG PO CAPS: 500.0000 mg | ORAL_CAPSULE | Freq: Four times a day (QID) | ORAL | 0 refills | Status: AC

## 2023-03-19 NOTE — ED Provider Notes (Signed)
[facility]    CSN: [id number] Arrival date & time: 03/19/23  0850      History   Chief Complaint Chief Complaint  Patient presents with   Insect Bite    HPI Melissa Rice is a 47 y.o. female. She was bitten by some kind of flying insect, maybe a mosquito, on 03/17/23. It was a little red that night but yesterday the redness spread signfiicantly and the red, warm area is now quite large. It is not itchy but she has been taking benadryl and using calamine lotion to help it. Denies fever or chills. Does not feel sick. Her friend is a PA and suggested she needs antibiotics.   HPI  Past Medical History:  Diagnosis Date   Anxiety    Depression    Diabetes mellitus without complication (HCC)    GESTATIONAL   Headache    OCCASIONAL    There are no problems to display for this patient.   Past Surgical History:  Procedure Laterality Date   DILATATION & CURETTAGE/HYSTEROSCOPY WITH MYOSURE N/A 10/02/2016   Procedure: DILATATION & CURETTAGE/HYSTEROSCOPY;  Surgeon: Philip Aspen, DO;  Location: [facility];  Service: Gynecology;  Laterality: N/A;   GANGLION CYST EXCISION Right 1992    OB History   No obstetric history on file.      Home Medications    Prior to Admission medications   Medication Sig Start Date End Date Taking? Authorizing Provider  cephALEXin (KEFLEX) 500 MG capsule Take 1 capsule (500 mg total) by mouth 4 (four) times daily for 7 days. 03/19/23 03/26/23 Yes Cathlyn Parsons, NP  DUPIXENT 300 MG/2ML SOPN Inject 300 mg into the skin. 03/18/23  Yes [provider]  acetaminophen (TYLENOL) 500 MG tablet Take 1,000 mg by mouth 2 (two) times daily as needed for headache.    [provider]  ibuprofen (ADVIL,MOTRIN) 200 MG tablet Take 400 mg by mouth 2 (two) times daily as needed for headache or moderate pain.    [provider]  valACYclovir (VALTREX) 500 MG tablet Take 1,000 mg by mouth 2 (two) times daily as needed (fever blisters).  For 3 to 4 days  07/24/16   [provider]    Family History No family history on file.  Social History Social History   Tobacco Use   Smoking status: Never   Smokeless tobacco: Never  Vaping Use   Vaping Use: Never used  Substance Use Topics   Alcohol use: Yes    Comment: OCCASIONAL   Drug use: No     Allergies   Patient has no known allergies.   Review of Systems Review of Systems   Physical Exam Triage Vital Signs ED Triage Vitals  Enc Vitals Group     BP 03/19/23 0911 129/81     Pulse Rate 03/19/23 0911 61     Resp 03/19/23 0911 16     Temp 03/19/23 0911 98.2 F (36.8 C)     Temp Source 03/19/23 0911 Oral     SpO2 03/19/23 0911 98 %     Weight --      Height --      Head Circumference --      Peak Flow --      Pain Score 03/19/23 0913 6     Pain Loc --      Pain Edu? --      Excl. in GC? --    No data found.  Updated Vital Signs BP 129/81 (BP  Location: Right Arm)   Pulse 61   Temp 98.2 F (36.8 C) (Oral)   Resp 16   SpO2 98%   Visual Acuity Right Eye Distance:   Left Eye Distance:   Bilateral Distance:    Right Eye Near:   Left Eye Near:    Bilateral Near:     Physical Exam Constitutional:      Appearance: Normal appearance.  Pulmonary:     Effort: Pulmonary effort is normal.  Musculoskeletal:       Legs:     Comments: Area of erythema and warmth is darker red in the 3cm radius around a central white spot (that pt notes was site of bite) with a lighter red color radiating out from that  Skin:    Findings: Erythema present.     Comments: See diagram under MSK exam for location of erythema and warmth. Rash is not raised  Neurological:     Mental Status: She is alert and oriented to person, place, and time.      UC Treatments / Results  Labs (all labs ordered are listed, but only abnormal results are displayed) Labs Reviewed - No data to display  EKG   Radiology No results found.  Procedures Procedures  (including critical care time)  Medications Ordered in UC Medications - No data to display  Initial Impression / Assessment and Plan / UC Course  I have reviewed the triage vital signs and the nursing notes.  Pertinent labs & imaging results that were available during my care of the patient were reviewed by me and considered in my medical decision making (see chart for details).    I marked erythema area with skin marker. Pt will monitor closely for spreading redness and for worsening systemic symptoms. This could be large local reaction vs cellulitis. Rx keflex.   Final Clinical Impressions(s) / UC Diagnoses   Final diagnoses:  Cellulitis of right lower extremity  Insect bite of right thigh, initial encounter     Discharge Instructions      Monitor your red area of skin - if it is spreading rapidly and you are feeling poorly (listen to your body!!), please go to the ER for further help. Do not delay seeking care.      ED Prescriptions     Medication Sig Dispense Auth. Provider   cephALEXin (KEFLEX) 500 MG capsule Take 1 capsule (500 mg total) by mouth 4 (four) times daily for 7 days. 28 capsule Cathlyn Parsons, NP      PDMP not reviewed this encounter.   Cathlyn Parsons, NP 03/19/23 1105

## 2023-03-19 NOTE — Discharge Instructions (Signed)
Monitor your red area of skin - if it is spreading rapidly and you are feeling poorly (listen to your body!!), please go to the ER for further help. Do not delay seeking care.

## 2023-03-19 NOTE — ED Triage Notes (Signed)
Patient with insect bite to right thigh. Patient states Wednesday she went hiking and thought it was just a mosquito bite but now the area is growing and is painful and hot to touch.

## 2023-03-29 DIAGNOSIS — M25572 Pain in left ankle and joints of left foot: Secondary | ICD-10-CM | POA: Diagnosis not present

## 2023-03-29 DIAGNOSIS — M25571 Pain in right ankle and joints of right foot: Secondary | ICD-10-CM | POA: Diagnosis not present

## 2023-03-29 DIAGNOSIS — G8929 Other chronic pain: Secondary | ICD-10-CM | POA: Diagnosis not present

## 2023-03-29 DIAGNOSIS — M7989 Other specified soft tissue disorders: Secondary | ICD-10-CM | POA: Diagnosis not present

## 2023-04-05 DIAGNOSIS — Z6825 Body mass index (BMI) 25.0-25.9, adult: Secondary | ICD-10-CM | POA: Diagnosis not present

## 2023-04-05 DIAGNOSIS — N951 Menopausal and female climacteric states: Secondary | ICD-10-CM | POA: Diagnosis not present

## 2023-04-05 DIAGNOSIS — R232 Flushing: Secondary | ICD-10-CM | POA: Diagnosis not present

## 2023-04-15 DIAGNOSIS — L820 Inflamed seborrheic keratosis: Secondary | ICD-10-CM | POA: Diagnosis not present

## 2023-04-15 DIAGNOSIS — L209 Atopic dermatitis, unspecified: Secondary | ICD-10-CM | POA: Diagnosis not present

## 2023-05-21 DIAGNOSIS — Z111 Encounter for screening for respiratory tuberculosis: Secondary | ICD-10-CM | POA: Diagnosis not present

## 2023-05-21 DIAGNOSIS — R5383 Other fatigue: Secondary | ICD-10-CM | POA: Diagnosis not present

## 2023-05-21 DIAGNOSIS — M2559 Pain in other specified joint: Secondary | ICD-10-CM | POA: Diagnosis not present

## 2023-06-08 DIAGNOSIS — L4059 Other psoriatic arthropathy: Secondary | ICD-10-CM | POA: Diagnosis not present

## 2023-08-11 DIAGNOSIS — L308 Other specified dermatitis: Secondary | ICD-10-CM | POA: Diagnosis not present

## 2023-08-11 DIAGNOSIS — D485 Neoplasm of uncertain behavior of skin: Secondary | ICD-10-CM | POA: Diagnosis not present

## 2023-08-16 ENCOUNTER — Ambulatory Visit
Admission: EM | Admit: 2023-08-16 | Discharge: 2023-08-16 | Disposition: A | Discharge status: Home / Self Care | Payer: BC Managed Care – PPO

## 2023-08-16 ENCOUNTER — Ambulatory Visit: Payer: BC Managed Care – PPO

## 2023-08-16 DIAGNOSIS — J189 Pneumonia, unspecified organism: Secondary | ICD-10-CM

## 2023-08-16 DIAGNOSIS — B002 Herpesviral gingivostomatitis and pharyngotonsillitis: Secondary | ICD-10-CM | POA: Insufficient documentation

## 2023-08-16 DIAGNOSIS — Z8632 Personal history of gestational diabetes: Secondary | ICD-10-CM | POA: Insufficient documentation

## 2023-08-16 DIAGNOSIS — I517 Cardiomegaly: Secondary | ICD-10-CM | POA: Diagnosis not present

## 2023-08-16 DIAGNOSIS — R059 Cough, unspecified: Secondary | ICD-10-CM | POA: Diagnosis not present

## 2023-08-16 DIAGNOSIS — R4586 Emotional lability: Secondary | ICD-10-CM | POA: Insufficient documentation

## 2023-08-16 DIAGNOSIS — R918 Other nonspecific abnormal finding of lung field: Secondary | ICD-10-CM | POA: Diagnosis not present

## 2023-08-16 DIAGNOSIS — R32 Unspecified urinary incontinence: Secondary | ICD-10-CM | POA: Insufficient documentation

## 2023-08-16 MED ORDER — AMOXICILLIN-POT CLAVULANATE 875-125 MG PO TABS: 1.0000 | ORAL_TABLET | Freq: Two times a day (BID) | ORAL | 0 refills | Status: AC

## 2023-08-16 NOTE — ED Triage Notes (Signed)
"  Starting Thursday I started with a tickle in my chest, now it is really really tight". Now a Cough "that is making that tightness worse". No sore throat. My ears are "full". No fever. "I did run yesterday a lot which didn't help".

## 2023-08-16 NOTE — ED Notes (Signed)
No visual history of EKG, Performed Rhythm strip and 2nd as well.

## 2023-08-16 NOTE — ED Provider Notes (Addendum)
[facility]    CSN: [id number] Arrival date & time: 08/16/23  0801      History   Chief Complaint Chief Complaint  Patient presents with   Cough   Chest Pain   Ear Fullness    HPI Melissa Rice is a 47 y.o. female.   Patient here today for evaluation of chest tightness that started about 4 to 5 days ago.  She reports that initially she just had a tickle but after running yesterday symptoms worsen.  She states she has this cough that is occasionally productive.  She notes that cough and deep breathing makes the chest tightness worse.  She denies any chest pain.  She has not any sore throat.  She has not had any fever.  She has been taking ibuprofen with mild improvement.  The history is provided by the patient.  Cough Associated symptoms: no chest pain, no chills, no ear pain, no eye discharge, no fever, no shortness of breath, no sore throat and no wheezing   Chest Pain Associated symptoms: cough   Associated symptoms: no abdominal pain, no fever, no nausea, no shortness of breath and no vomiting   Ear Fullness Pertinent negatives include no chest pain, no abdominal pain and no shortness of breath.    Past Medical History:  Diagnosis Date   Anxiety    Depression    Diabetes mellitus without complication (HCC)    GESTATIONAL   Headache    OCCASIONAL    Patient Active Problem List   Diagnosis Date Noted   Urinary incontinence 08/16/2023   Oral herpes simplex infection 08/16/2023   History of gestational diabetes mellitus 08/16/2023   Mood swings 08/16/2023   Abnormal mammography 11/07/2020   Abnormal vaginal bleeding 11/15/2018   Irregular periods 07/24/2016   Reduced libido 02/27/2015    Past Surgical History:  Procedure Laterality Date   DILATATION & CURETTAGE/HYSTEROSCOPY WITH MYOSURE N/A 10/02/2016   Procedure: DILATATION & CURETTAGE/HYSTEROSCOPY;  Surgeon: Philip Aspen, DO;  Location: [facility];  Service: Gynecology;  Laterality: N/A;    GANGLION CYST EXCISION Right 1992    OB History   No obstetric history on file.      Home Medications    Prior to Admission medications   Medication Sig Start Date End Date Taking? Authorizing Provider  amoxicillin-clavulanate (AUGMENTIN) 875-125 MG tablet Take 1 tablet by mouth every 12 (twelve) hours. 08/16/23  Yes Tomi Bamberger, PA-C  HUMIRA, 2 PEN, 40 MG/0.4ML pen SMARTSIG:40 Milligram(s) SUB-Q Every 2 Weeks 08/13/23  Yes [provider]  valACYclovir (VALTREX) 1000 MG tablet valacyclovir 1 gram tablet  TAKE 1 TABLET BY MOUTH THREE TIMES DAILY FOR 7 DAYS   Yes [provider]  acetaminophen (TYLENOL) 500 MG tablet Take 1,000 mg by mouth 2 (two) times daily as needed for headache.    [provider]  azithromycin (ZITHROMAX) 250 MG tablet Take 250 mg by mouth as directed.    [provider]  clotrimazole-betamethasone (LOTRISONE) cream Apply 1 Application topically.    [provider]  DUPIXENT 300 MG/2ML SOPN Inject 300 mg into the skin. 03/18/23   [provider]  ibuprofen (ADVIL,MOTRIN) 200 MG tablet Take 400 mg by mouth 2 (two) times daily as needed for headache or moderate pain.    [provider]  ketoconazole (NIZORAL) 2 % shampoo Apply 1 Application topically as directed.    [provider]  oseltamivir (TAMIFLU) 75 MG capsule Take 75 mg by mouth as directed.  [provider]  predniSONE (DELTASONE) 20 MG tablet Take 20 mg by mouth as directed.    [provider]  triamcinolone ointment (KENALOG) 0.1 % Apply 1 Application topically 2 (two) times daily.    [provider]    Family History History reviewed. No pertinent family history.  Social History Social History   Tobacco Use   Smoking status: Never   Smokeless tobacco: Never  Vaping Use   Vaping status: Never Used  Substance Use Topics   Alcohol use: Yes    Comment: OCCASIONAL   Drug use: No      Allergies   Sulfamethoxazole-trimethoprim   Review of Systems Review of Systems  Constitutional:  Negative for chills and fever.  HENT:  Positive for congestion. Negative for ear pain and sore throat.   Eyes:  Negative for discharge and redness.  Respiratory:  Positive for cough and chest tightness. Negative for shortness of breath and wheezing.   Cardiovascular:  Negative for chest pain.  Gastrointestinal:  Negative for abdominal pain, diarrhea, nausea and vomiting.     Physical Exam Triage Vital Signs ED Triage Vitals  Encounter Vitals Group     BP 08/16/23 0813 135/79     Systolic BP Percentile --      Diastolic BP Percentile --      Pulse Rate 08/16/23 0813 64     Resp 08/16/23 0813 20     Temp 08/16/23 0813 98.8 F (37.1 C)     Temp Source 08/16/23 0813 Oral     SpO2 08/16/23 0813 97 %     Weight 08/16/23 0810 120 lb (54.4 kg)     Height 08/16/23 0810 4\' 10"  (1.473 m)     Head Circumference --      Peak Flow --      Pain Score 08/16/23 0807 0     Pain Loc --      Pain Education --      Exclude from Growth Chart --    No data found.  Updated Vital Signs BP 135/79 (BP Location: Left Arm)   Pulse 64   Temp 98.8 F (37.1 C) (Oral)   Resp 20   Ht 4\' 10"  (1.473 m)   Wt 120 lb (54.4 kg)   SpO2 97%   BMI 25.08 kg/m       Physical Exam Vitals and nursing note reviewed.  Constitutional:      General: She is not in acute distress.    Appearance: Normal appearance. She is not ill-appearing.  HENT:     Head: Normocephalic and atraumatic.     Nose: Congestion present.     Mouth/Throat:     Mouth: Mucous membranes are moist.     Pharynx: No oropharyngeal exudate or posterior oropharyngeal erythema.  Eyes:     Conjunctiva/sclera: Conjunctivae normal.  Cardiovascular:     Rate and Rhythm: Normal rate and regular rhythm.     Heart sounds: Normal heart sounds. No murmur heard. Pulmonary:     Effort: Pulmonary effort is normal. No respiratory distress.      Breath sounds: Normal breath sounds. No wheezing, rhonchi or rales.  Skin:    General: Skin is warm and dry.  Neurological:     Mental Status: She is alert.  Psychiatric:        Mood and Affect: Mood normal.        Thought Content: Thought content normal.      UC Treatments / Results  Labs (all  labs ordered are listed, but only abnormal results are displayed) Labs Reviewed - No data to display  EKG   Radiology DG Chest 2 View  Result Date: 08/16/2023 CLINICAL DATA:  Cough. EXAM: CHEST - 2 VIEW COMPARISON:  None Available. FINDINGS: Trachea is midline. Heart is enlarged. Difficult to exclude mild basilar interstitial prominence. No airspace consolidation or pleural fluid. IMPRESSION: Difficult exclude mild basilar interstitial prominence and therefore edema or viral pneumonia. Electronically Signed   By: Leanna Battles M.D.   On: 08/16/2023 09:30    Procedures Procedures (including critical care time)  Medications Ordered in UC Medications - No data to display  Initial Impression / Assessment and Plan / UC Course  I have reviewed the triage vital signs and the nursing notes.  Pertinent labs & imaging results that were available during my care of the patient were reviewed by me and considered in my medical decision making (see chart for details).    EKG without concerning findings.  Chest x-ray with findings consistent with pneumonia.  Augmentin prescribed to cover possible pneumonia.  Recommended follow-up if no gradual improvement with any further concerns.  Advised emergency department for any worsening.  Patient expresses understanding.  Final Clinical Impressions(s) / UC Diagnoses   Final diagnoses:  Pneumonia of both lower lobes due to infectious organism   Discharge Instructions   None    ED Prescriptions     Medication Sig Dispense Auth. Provider   amoxicillin-clavulanate (AUGMENTIN) 875-125 MG tablet Take 1 tablet by mouth every 12 (twelve) hours. 14  tablet Tomi Bamberger, PA-C      PDMP not reviewed this encounter.   Tomi Bamberger, PA-C 08/16/23 0944    Tomi Bamberger, PA-C 08/16/23 [phone]

## 2023-09-10 DIAGNOSIS — R531 Weakness: Secondary | ICD-10-CM | POA: Diagnosis not present

## 2023-09-10 DIAGNOSIS — M7662 Achilles tendinitis, left leg: Secondary | ICD-10-CM | POA: Diagnosis not present

## 2023-09-10 DIAGNOSIS — M7661 Achilles tendinitis, right leg: Secondary | ICD-10-CM | POA: Diagnosis not present

## 2023-09-14 DIAGNOSIS — L4059 Other psoriatic arthropathy: Secondary | ICD-10-CM | POA: Diagnosis not present

## 2023-09-15 DIAGNOSIS — M7661 Achilles tendinitis, right leg: Secondary | ICD-10-CM | POA: Diagnosis not present

## 2023-09-15 DIAGNOSIS — M7662 Achilles tendinitis, left leg: Secondary | ICD-10-CM | POA: Diagnosis not present

## 2023-09-15 DIAGNOSIS — R531 Weakness: Secondary | ICD-10-CM | POA: Diagnosis not present

## 2023-09-16 DIAGNOSIS — R531 Weakness: Secondary | ICD-10-CM | POA: Diagnosis not present

## 2023-09-16 DIAGNOSIS — M7661 Achilles tendinitis, right leg: Secondary | ICD-10-CM | POA: Diagnosis not present

## 2023-09-16 DIAGNOSIS — M7662 Achilles tendinitis, left leg: Secondary | ICD-10-CM | POA: Diagnosis not present

## 2023-09-30 DIAGNOSIS — R531 Weakness: Secondary | ICD-10-CM | POA: Diagnosis not present

## 2023-09-30 DIAGNOSIS — M7661 Achilles tendinitis, right leg: Secondary | ICD-10-CM | POA: Diagnosis not present

## 2023-09-30 DIAGNOSIS — M7662 Achilles tendinitis, left leg: Secondary | ICD-10-CM | POA: Diagnosis not present

## 2023-10-01 DIAGNOSIS — M7662 Achilles tendinitis, left leg: Secondary | ICD-10-CM | POA: Diagnosis not present

## 2023-10-01 DIAGNOSIS — R531 Weakness: Secondary | ICD-10-CM | POA: Diagnosis not present

## 2023-10-01 DIAGNOSIS — M7661 Achilles tendinitis, right leg: Secondary | ICD-10-CM | POA: Diagnosis not present

## 2023-10-08 DIAGNOSIS — M7662 Achilles tendinitis, left leg: Secondary | ICD-10-CM | POA: Diagnosis not present

## 2023-10-08 DIAGNOSIS — M7661 Achilles tendinitis, right leg: Secondary | ICD-10-CM | POA: Diagnosis not present

## 2023-10-13 DIAGNOSIS — M7661 Achilles tendinitis, right leg: Secondary | ICD-10-CM | POA: Diagnosis not present

## 2023-10-13 DIAGNOSIS — M7662 Achilles tendinitis, left leg: Secondary | ICD-10-CM | POA: Diagnosis not present

## 2023-10-13 DIAGNOSIS — R531 Weakness: Secondary | ICD-10-CM | POA: Diagnosis not present

## 2023-10-20 DIAGNOSIS — M7662 Achilles tendinitis, left leg: Secondary | ICD-10-CM | POA: Diagnosis not present

## 2023-10-20 DIAGNOSIS — R531 Weakness: Secondary | ICD-10-CM | POA: Diagnosis not present

## 2023-10-20 DIAGNOSIS — M7661 Achilles tendinitis, right leg: Secondary | ICD-10-CM | POA: Diagnosis not present

## 2023-10-22 DIAGNOSIS — M7661 Achilles tendinitis, right leg: Secondary | ICD-10-CM | POA: Diagnosis not present

## 2023-10-22 DIAGNOSIS — R531 Weakness: Secondary | ICD-10-CM | POA: Diagnosis not present

## 2023-10-22 DIAGNOSIS — M7662 Achilles tendinitis, left leg: Secondary | ICD-10-CM | POA: Diagnosis not present

## 2023-10-29 DIAGNOSIS — M25572 Pain in left ankle and joints of left foot: Secondary | ICD-10-CM | POA: Diagnosis not present

## 2023-10-29 DIAGNOSIS — M722 Plantar fascial fibromatosis: Secondary | ICD-10-CM | POA: Diagnosis not present

## 2023-10-29 DIAGNOSIS — M25571 Pain in right ankle and joints of right foot: Secondary | ICD-10-CM | POA: Diagnosis not present

## 2023-10-31 DIAGNOSIS — R051 Acute cough: Secondary | ICD-10-CM | POA: Diagnosis not present

## 2023-11-03 DIAGNOSIS — M25572 Pain in left ankle and joints of left foot: Secondary | ICD-10-CM | POA: Diagnosis not present

## 2023-11-03 DIAGNOSIS — M722 Plantar fascial fibromatosis: Secondary | ICD-10-CM | POA: Diagnosis not present

## 2023-11-03 DIAGNOSIS — M25571 Pain in right ankle and joints of right foot: Secondary | ICD-10-CM | POA: Diagnosis not present

## 2023-11-04 DIAGNOSIS — R262 Difficulty in walking, not elsewhere classified: Secondary | ICD-10-CM | POA: Diagnosis not present

## 2023-11-04 DIAGNOSIS — M25572 Pain in left ankle and joints of left foot: Secondary | ICD-10-CM | POA: Diagnosis not present

## 2023-11-04 DIAGNOSIS — M722 Plantar fascial fibromatosis: Secondary | ICD-10-CM | POA: Diagnosis not present

## 2023-11-04 DIAGNOSIS — M25571 Pain in right ankle and joints of right foot: Secondary | ICD-10-CM | POA: Diagnosis not present

## 2023-11-10 DIAGNOSIS — M25571 Pain in right ankle and joints of right foot: Secondary | ICD-10-CM | POA: Diagnosis not present

## 2023-11-10 DIAGNOSIS — M722 Plantar fascial fibromatosis: Secondary | ICD-10-CM | POA: Diagnosis not present

## 2023-11-10 DIAGNOSIS — M25572 Pain in left ankle and joints of left foot: Secondary | ICD-10-CM | POA: Diagnosis not present

## 2023-11-10 DIAGNOSIS — R262 Difficulty in walking, not elsewhere classified: Secondary | ICD-10-CM | POA: Diagnosis not present

## 2023-11-11 DIAGNOSIS — M25571 Pain in right ankle and joints of right foot: Secondary | ICD-10-CM | POA: Diagnosis not present

## 2023-11-11 DIAGNOSIS — M722 Plantar fascial fibromatosis: Secondary | ICD-10-CM | POA: Diagnosis not present

## 2023-11-11 DIAGNOSIS — R262 Difficulty in walking, not elsewhere classified: Secondary | ICD-10-CM | POA: Diagnosis not present

## 2023-11-11 DIAGNOSIS — M25572 Pain in left ankle and joints of left foot: Secondary | ICD-10-CM | POA: Diagnosis not present

## 2023-11-19 DIAGNOSIS — M25572 Pain in left ankle and joints of left foot: Secondary | ICD-10-CM | POA: Diagnosis not present

## 2023-11-19 DIAGNOSIS — M25571 Pain in right ankle and joints of right foot: Secondary | ICD-10-CM | POA: Diagnosis not present

## 2023-11-19 DIAGNOSIS — R262 Difficulty in walking, not elsewhere classified: Secondary | ICD-10-CM | POA: Diagnosis not present

## 2023-11-19 DIAGNOSIS — M722 Plantar fascial fibromatosis: Secondary | ICD-10-CM | POA: Diagnosis not present

## 2023-11-22 DIAGNOSIS — M7661 Achilles tendinitis, right leg: Secondary | ICD-10-CM | POA: Diagnosis not present

## 2023-11-22 DIAGNOSIS — M7662 Achilles tendinitis, left leg: Secondary | ICD-10-CM | POA: Diagnosis not present

## 2023-11-23 DIAGNOSIS — M25572 Pain in left ankle and joints of left foot: Secondary | ICD-10-CM | POA: Diagnosis not present

## 2023-11-23 DIAGNOSIS — M722 Plantar fascial fibromatosis: Secondary | ICD-10-CM | POA: Diagnosis not present

## 2023-11-23 DIAGNOSIS — R262 Difficulty in walking, not elsewhere classified: Secondary | ICD-10-CM | POA: Diagnosis not present

## 2023-11-23 DIAGNOSIS — M25571 Pain in right ankle and joints of right foot: Secondary | ICD-10-CM | POA: Diagnosis not present

## 2023-11-25 DIAGNOSIS — M25572 Pain in left ankle and joints of left foot: Secondary | ICD-10-CM | POA: Diagnosis not present

## 2023-11-25 DIAGNOSIS — M722 Plantar fascial fibromatosis: Secondary | ICD-10-CM | POA: Diagnosis not present

## 2023-11-25 DIAGNOSIS — R262 Difficulty in walking, not elsewhere classified: Secondary | ICD-10-CM | POA: Diagnosis not present

## 2023-11-25 DIAGNOSIS — M25571 Pain in right ankle and joints of right foot: Secondary | ICD-10-CM | POA: Diagnosis not present

## 2023-12-14 DIAGNOSIS — R262 Difficulty in walking, not elsewhere classified: Secondary | ICD-10-CM | POA: Diagnosis not present

## 2023-12-14 DIAGNOSIS — M25571 Pain in right ankle and joints of right foot: Secondary | ICD-10-CM | POA: Diagnosis not present

## 2023-12-14 DIAGNOSIS — M722 Plantar fascial fibromatosis: Secondary | ICD-10-CM | POA: Diagnosis not present

## 2023-12-14 DIAGNOSIS — M25572 Pain in left ankle and joints of left foot: Secondary | ICD-10-CM | POA: Diagnosis not present

## 2023-12-15 DIAGNOSIS — L4059 Other psoriatic arthropathy: Secondary | ICD-10-CM | POA: Diagnosis not present

## 2023-12-15 DIAGNOSIS — Z13220 Encounter for screening for lipoid disorders: Secondary | ICD-10-CM | POA: Diagnosis not present

## 2023-12-16 DIAGNOSIS — R262 Difficulty in walking, not elsewhere classified: Secondary | ICD-10-CM | POA: Diagnosis not present

## 2023-12-16 DIAGNOSIS — M25571 Pain in right ankle and joints of right foot: Secondary | ICD-10-CM | POA: Diagnosis not present

## 2023-12-16 DIAGNOSIS — M722 Plantar fascial fibromatosis: Secondary | ICD-10-CM | POA: Diagnosis not present

## 2023-12-16 DIAGNOSIS — M25572 Pain in left ankle and joints of left foot: Secondary | ICD-10-CM | POA: Diagnosis not present

## 2023-12-20 DIAGNOSIS — R262 Difficulty in walking, not elsewhere classified: Secondary | ICD-10-CM | POA: Diagnosis not present

## 2023-12-20 DIAGNOSIS — M25572 Pain in left ankle and joints of left foot: Secondary | ICD-10-CM | POA: Diagnosis not present

## 2023-12-20 DIAGNOSIS — M722 Plantar fascial fibromatosis: Secondary | ICD-10-CM | POA: Diagnosis not present

## 2023-12-20 DIAGNOSIS — M25571 Pain in right ankle and joints of right foot: Secondary | ICD-10-CM | POA: Diagnosis not present

## 2023-12-24 DIAGNOSIS — Z01419 Encounter for gynecological examination (general) (routine) without abnormal findings: Secondary | ICD-10-CM | POA: Diagnosis not present

## 2023-12-24 DIAGNOSIS — Z1231 Encounter for screening mammogram for malignant neoplasm of breast: Secondary | ICD-10-CM | POA: Diagnosis not present

## 2023-12-24 DIAGNOSIS — Z124 Encounter for screening for malignant neoplasm of cervix: Secondary | ICD-10-CM | POA: Diagnosis not present

## 2024-01-05 DIAGNOSIS — M7661 Achilles tendinitis, right leg: Secondary | ICD-10-CM | POA: Diagnosis not present

## 2024-01-05 DIAGNOSIS — M7662 Achilles tendinitis, left leg: Secondary | ICD-10-CM | POA: Diagnosis not present

## 2024-01-14 DIAGNOSIS — M25572 Pain in left ankle and joints of left foot: Secondary | ICD-10-CM | POA: Diagnosis not present

## 2024-01-14 DIAGNOSIS — R262 Difficulty in walking, not elsewhere classified: Secondary | ICD-10-CM | POA: Diagnosis not present

## 2024-01-14 DIAGNOSIS — M722 Plantar fascial fibromatosis: Secondary | ICD-10-CM | POA: Diagnosis not present

## 2024-01-14 DIAGNOSIS — M25571 Pain in right ankle and joints of right foot: Secondary | ICD-10-CM | POA: Diagnosis not present

## 2024-01-17 DIAGNOSIS — M25551 Pain in right hip: Secondary | ICD-10-CM | POA: Diagnosis not present

## 2024-01-17 DIAGNOSIS — S76011S Strain of muscle, fascia and tendon of right hip, sequela: Secondary | ICD-10-CM | POA: Diagnosis not present

## 2024-01-19 DIAGNOSIS — S76011S Strain of muscle, fascia and tendon of right hip, sequela: Secondary | ICD-10-CM | POA: Diagnosis not present

## 2024-01-19 DIAGNOSIS — M25551 Pain in right hip: Secondary | ICD-10-CM | POA: Diagnosis not present

## 2024-03-22 DIAGNOSIS — Z79899 Other long term (current) drug therapy: Secondary | ICD-10-CM | POA: Diagnosis not present

## 2024-03-22 DIAGNOSIS — L308 Other specified dermatitis: Secondary | ICD-10-CM | POA: Diagnosis not present

## 2024-03-22 DIAGNOSIS — L4059 Other psoriatic arthropathy: Secondary | ICD-10-CM | POA: Diagnosis not present

## 2024-03-22 DIAGNOSIS — Z13220 Encounter for screening for lipoid disorders: Secondary | ICD-10-CM | POA: Diagnosis not present

## 2024-04-26 DIAGNOSIS — J029 Acute pharyngitis, unspecified: Secondary | ICD-10-CM | POA: Diagnosis not present

## 2024-04-26 DIAGNOSIS — R509 Fever, unspecified: Secondary | ICD-10-CM | POA: Diagnosis not present

## 2024-05-12 DIAGNOSIS — Z13220 Encounter for screening for lipoid disorders: Secondary | ICD-10-CM | POA: Diagnosis not present

## 2024-05-12 DIAGNOSIS — Z0000 Encounter for general adult medical examination without abnormal findings: Secondary | ICD-10-CM | POA: Diagnosis not present

## 2024-05-12 DIAGNOSIS — Z23 Encounter for immunization: Secondary | ICD-10-CM | POA: Diagnosis not present

## 2024-05-18 DIAGNOSIS — M7661 Achilles tendinitis, right leg: Secondary | ICD-10-CM | POA: Diagnosis not present

## 2024-05-22 DIAGNOSIS — L308 Other specified dermatitis: Secondary | ICD-10-CM | POA: Diagnosis not present

## 2024-05-22 DIAGNOSIS — Z79899 Other long term (current) drug therapy: Secondary | ICD-10-CM | POA: Diagnosis not present

## 2024-05-22 DIAGNOSIS — L299 Pruritus, unspecified: Secondary | ICD-10-CM | POA: Diagnosis not present

## 2024-05-22 DIAGNOSIS — L209 Atopic dermatitis, unspecified: Secondary | ICD-10-CM | POA: Diagnosis not present

## 2024-05-22 DIAGNOSIS — L4059 Other psoriatic arthropathy: Secondary | ICD-10-CM | POA: Diagnosis not present

## 2024-05-24 DIAGNOSIS — M25571 Pain in right ankle and joints of right foot: Secondary | ICD-10-CM | POA: Diagnosis not present

## 2024-06-02 DIAGNOSIS — M25572 Pain in left ankle and joints of left foot: Secondary | ICD-10-CM | POA: Diagnosis not present

## 2024-06-02 DIAGNOSIS — M25571 Pain in right ankle and joints of right foot: Secondary | ICD-10-CM | POA: Diagnosis not present

## 2024-06-02 DIAGNOSIS — M722 Plantar fascial fibromatosis: Secondary | ICD-10-CM | POA: Diagnosis not present

## 2024-06-02 DIAGNOSIS — R262 Difficulty in walking, not elsewhere classified: Secondary | ICD-10-CM | POA: Diagnosis not present

## 2024-06-05 DIAGNOSIS — M722 Plantar fascial fibromatosis: Secondary | ICD-10-CM | POA: Diagnosis not present

## 2024-06-05 DIAGNOSIS — M25571 Pain in right ankle and joints of right foot: Secondary | ICD-10-CM | POA: Diagnosis not present

## 2024-06-05 DIAGNOSIS — M25572 Pain in left ankle and joints of left foot: Secondary | ICD-10-CM | POA: Diagnosis not present

## 2024-06-05 DIAGNOSIS — R262 Difficulty in walking, not elsewhere classified: Secondary | ICD-10-CM | POA: Diagnosis not present

## 2024-06-06 DIAGNOSIS — M25572 Pain in left ankle and joints of left foot: Secondary | ICD-10-CM | POA: Diagnosis not present

## 2024-06-06 DIAGNOSIS — M25571 Pain in right ankle and joints of right foot: Secondary | ICD-10-CM | POA: Diagnosis not present

## 2024-06-06 DIAGNOSIS — M722 Plantar fascial fibromatosis: Secondary | ICD-10-CM | POA: Diagnosis not present

## 2024-06-06 DIAGNOSIS — R262 Difficulty in walking, not elsewhere classified: Secondary | ICD-10-CM | POA: Diagnosis not present

## 2024-06-07 DIAGNOSIS — M722 Plantar fascial fibromatosis: Secondary | ICD-10-CM | POA: Diagnosis not present

## 2024-06-07 DIAGNOSIS — M25572 Pain in left ankle and joints of left foot: Secondary | ICD-10-CM | POA: Diagnosis not present

## 2024-06-07 DIAGNOSIS — M25571 Pain in right ankle and joints of right foot: Secondary | ICD-10-CM | POA: Diagnosis not present

## 2024-06-07 DIAGNOSIS — R262 Difficulty in walking, not elsewhere classified: Secondary | ICD-10-CM | POA: Diagnosis not present
